# Patient Record
Sex: Male | Born: 1960 | Hispanic: No | Marital: Married | Smoking: Never smoker
Health system: Southern US, Community
[De-identification: ages and names within clinical notes are randomized; demographics above are authoritative.]

## PROBLEM LIST (undated history)

## (undated) DIAGNOSIS — I10 Essential (primary) hypertension: Secondary | ICD-10-CM

## (undated) DIAGNOSIS — K219 Gastro-esophageal reflux disease without esophagitis: Secondary | ICD-10-CM

## (undated) DIAGNOSIS — G4730 Sleep apnea, unspecified: Secondary | ICD-10-CM

## (undated) DIAGNOSIS — S82142A Displaced bicondylar fracture of left tibia, initial encounter for closed fracture: Secondary | ICD-10-CM

---

## 2016-10-29 DIAGNOSIS — J301 Allergic rhinitis due to pollen: Secondary | ICD-10-CM | POA: Diagnosis not present

## 2016-11-05 DIAGNOSIS — J301 Allergic rhinitis due to pollen: Secondary | ICD-10-CM | POA: Diagnosis not present

## 2016-11-05 DIAGNOSIS — J3089 Other allergic rhinitis: Secondary | ICD-10-CM | POA: Diagnosis not present

## 2016-11-07 DIAGNOSIS — L858 Other specified epidermal thickening: Secondary | ICD-10-CM | POA: Diagnosis not present

## 2016-11-07 DIAGNOSIS — L570 Actinic keratosis: Secondary | ICD-10-CM | POA: Diagnosis not present

## 2016-11-07 DIAGNOSIS — C44622 Squamous cell carcinoma of skin of right upper limb, including shoulder: Secondary | ICD-10-CM | POA: Diagnosis not present

## 2016-11-13 DIAGNOSIS — G4733 Obstructive sleep apnea (adult) (pediatric): Secondary | ICD-10-CM | POA: Diagnosis not present

## 2016-11-21 DIAGNOSIS — G4733 Obstructive sleep apnea (adult) (pediatric): Secondary | ICD-10-CM | POA: Diagnosis not present

## 2016-11-25 DIAGNOSIS — J3089 Other allergic rhinitis: Secondary | ICD-10-CM | POA: Diagnosis not present

## 2016-11-25 DIAGNOSIS — J301 Allergic rhinitis due to pollen: Secondary | ICD-10-CM | POA: Diagnosis not present

## 2016-11-27 DIAGNOSIS — J3089 Other allergic rhinitis: Secondary | ICD-10-CM | POA: Diagnosis not present

## 2016-12-02 DIAGNOSIS — J3089 Other allergic rhinitis: Secondary | ICD-10-CM | POA: Diagnosis not present

## 2016-12-02 DIAGNOSIS — J301 Allergic rhinitis due to pollen: Secondary | ICD-10-CM | POA: Diagnosis not present

## 2016-12-03 DIAGNOSIS — R7309 Other abnormal glucose: Secondary | ICD-10-CM | POA: Diagnosis not present

## 2016-12-03 DIAGNOSIS — G4733 Obstructive sleep apnea (adult) (pediatric): Secondary | ICD-10-CM | POA: Diagnosis not present

## 2016-12-03 DIAGNOSIS — Z1159 Encounter for screening for other viral diseases: Secondary | ICD-10-CM | POA: Diagnosis not present

## 2016-12-03 DIAGNOSIS — Z0000 Encounter for general adult medical examination without abnormal findings: Secondary | ICD-10-CM | POA: Diagnosis not present

## 2016-12-03 DIAGNOSIS — I10 Essential (primary) hypertension: Secondary | ICD-10-CM | POA: Diagnosis not present

## 2016-12-03 DIAGNOSIS — E7800 Pure hypercholesterolemia, unspecified: Secondary | ICD-10-CM | POA: Diagnosis not present

## 2016-12-03 DIAGNOSIS — Z1389 Encounter for screening for other disorder: Secondary | ICD-10-CM | POA: Diagnosis not present

## 2016-12-05 DIAGNOSIS — J3089 Other allergic rhinitis: Secondary | ICD-10-CM | POA: Diagnosis not present

## 2016-12-05 DIAGNOSIS — J301 Allergic rhinitis due to pollen: Secondary | ICD-10-CM | POA: Diagnosis not present

## 2016-12-09 DIAGNOSIS — J301 Allergic rhinitis due to pollen: Secondary | ICD-10-CM | POA: Diagnosis not present

## 2016-12-09 DIAGNOSIS — J3089 Other allergic rhinitis: Secondary | ICD-10-CM | POA: Diagnosis not present

## 2016-12-12 DIAGNOSIS — L570 Actinic keratosis: Secondary | ICD-10-CM | POA: Diagnosis not present

## 2016-12-12 DIAGNOSIS — Z85828 Personal history of other malignant neoplasm of skin: Secondary | ICD-10-CM | POA: Diagnosis not present

## 2016-12-12 DIAGNOSIS — L905 Scar conditions and fibrosis of skin: Secondary | ICD-10-CM | POA: Diagnosis not present

## 2016-12-23 DIAGNOSIS — J301 Allergic rhinitis due to pollen: Secondary | ICD-10-CM | POA: Diagnosis not present

## 2016-12-23 DIAGNOSIS — J3089 Other allergic rhinitis: Secondary | ICD-10-CM | POA: Diagnosis not present

## 2016-12-30 DIAGNOSIS — J301 Allergic rhinitis due to pollen: Secondary | ICD-10-CM | POA: Diagnosis not present

## 2016-12-30 DIAGNOSIS — J3089 Other allergic rhinitis: Secondary | ICD-10-CM | POA: Diagnosis not present

## 2017-01-02 DIAGNOSIS — J301 Allergic rhinitis due to pollen: Secondary | ICD-10-CM | POA: Diagnosis not present

## 2017-01-06 DIAGNOSIS — J3089 Other allergic rhinitis: Secondary | ICD-10-CM | POA: Diagnosis not present

## 2017-01-06 DIAGNOSIS — J301 Allergic rhinitis due to pollen: Secondary | ICD-10-CM | POA: Diagnosis not present

## 2017-01-13 DIAGNOSIS — J301 Allergic rhinitis due to pollen: Secondary | ICD-10-CM | POA: Diagnosis not present

## 2017-01-13 DIAGNOSIS — J3089 Other allergic rhinitis: Secondary | ICD-10-CM | POA: Diagnosis not present

## 2017-01-15 DIAGNOSIS — J3089 Other allergic rhinitis: Secondary | ICD-10-CM | POA: Diagnosis not present

## 2017-01-20 DIAGNOSIS — J301 Allergic rhinitis due to pollen: Secondary | ICD-10-CM | POA: Diagnosis not present

## 2017-01-20 DIAGNOSIS — J3089 Other allergic rhinitis: Secondary | ICD-10-CM | POA: Diagnosis not present

## 2017-01-27 DIAGNOSIS — J301 Allergic rhinitis due to pollen: Secondary | ICD-10-CM | POA: Diagnosis not present

## 2017-01-27 DIAGNOSIS — J3089 Other allergic rhinitis: Secondary | ICD-10-CM | POA: Diagnosis not present

## 2017-02-03 DIAGNOSIS — J301 Allergic rhinitis due to pollen: Secondary | ICD-10-CM | POA: Diagnosis not present

## 2017-02-03 DIAGNOSIS — J3089 Other allergic rhinitis: Secondary | ICD-10-CM | POA: Diagnosis not present

## 2017-02-05 ENCOUNTER — Other Ambulatory Visit: Payer: Self-pay | Admitting: Gastroenterology

## 2017-02-10 DIAGNOSIS — J301 Allergic rhinitis due to pollen: Secondary | ICD-10-CM | POA: Diagnosis not present

## 2017-02-10 DIAGNOSIS — J3089 Other allergic rhinitis: Secondary | ICD-10-CM | POA: Diagnosis not present

## 2017-02-17 DIAGNOSIS — J301 Allergic rhinitis due to pollen: Secondary | ICD-10-CM | POA: Diagnosis not present

## 2017-02-17 DIAGNOSIS — J3089 Other allergic rhinitis: Secondary | ICD-10-CM | POA: Diagnosis not present

## 2017-02-24 DIAGNOSIS — J3089 Other allergic rhinitis: Secondary | ICD-10-CM | POA: Diagnosis not present

## 2017-02-24 DIAGNOSIS — G4733 Obstructive sleep apnea (adult) (pediatric): Secondary | ICD-10-CM | POA: Diagnosis not present

## 2017-02-24 DIAGNOSIS — J301 Allergic rhinitis due to pollen: Secondary | ICD-10-CM | POA: Diagnosis not present

## 2017-02-26 DIAGNOSIS — L905 Scar conditions and fibrosis of skin: Secondary | ICD-10-CM | POA: Diagnosis not present

## 2017-02-26 DIAGNOSIS — Z85828 Personal history of other malignant neoplasm of skin: Secondary | ICD-10-CM | POA: Diagnosis not present

## 2017-03-03 DIAGNOSIS — J301 Allergic rhinitis due to pollen: Secondary | ICD-10-CM | POA: Diagnosis not present

## 2017-03-03 DIAGNOSIS — J3089 Other allergic rhinitis: Secondary | ICD-10-CM | POA: Diagnosis not present

## 2017-03-10 DIAGNOSIS — J301 Allergic rhinitis due to pollen: Secondary | ICD-10-CM | POA: Diagnosis not present

## 2017-03-10 DIAGNOSIS — J3089 Other allergic rhinitis: Secondary | ICD-10-CM | POA: Diagnosis not present

## 2017-03-17 DIAGNOSIS — J3089 Other allergic rhinitis: Secondary | ICD-10-CM | POA: Diagnosis not present

## 2017-03-17 DIAGNOSIS — J301 Allergic rhinitis due to pollen: Secondary | ICD-10-CM | POA: Diagnosis not present

## 2017-03-25 DIAGNOSIS — J3089 Other allergic rhinitis: Secondary | ICD-10-CM | POA: Diagnosis not present

## 2017-03-25 DIAGNOSIS — J301 Allergic rhinitis due to pollen: Secondary | ICD-10-CM | POA: Diagnosis not present

## 2017-04-01 ENCOUNTER — Ambulatory Visit (HOSPITAL_COMMUNITY): Payer: Commercial Managed Care - HMO | Admitting: Certified Registered Nurse Anesthetist

## 2017-04-01 ENCOUNTER — Encounter (HOSPITAL_COMMUNITY): Payer: Self-pay | Admitting: *Deleted

## 2017-04-01 ENCOUNTER — Encounter (HOSPITAL_COMMUNITY)
Admission: RE | Disposition: A | Discharge status: Home / Self Care | Payer: Self-pay | Source: Ambulatory Visit | Attending: Gastroenterology

## 2017-04-01 ENCOUNTER — Ambulatory Visit (HOSPITAL_COMMUNITY)
Admission: RE | Admit: 2017-04-01 | Discharge: 2017-04-01 | Disposition: A | Discharge status: Home / Self Care | Payer: Commercial Managed Care - HMO | Source: Ambulatory Visit | Attending: Gastroenterology | Admitting: Gastroenterology

## 2017-04-01 DIAGNOSIS — I10 Essential (primary) hypertension: Secondary | ICD-10-CM | POA: Insufficient documentation

## 2017-04-01 DIAGNOSIS — D122 Benign neoplasm of ascending colon: Secondary | ICD-10-CM | POA: Insufficient documentation

## 2017-04-01 DIAGNOSIS — K219 Gastro-esophageal reflux disease without esophagitis: Secondary | ICD-10-CM | POA: Insufficient documentation

## 2017-04-01 DIAGNOSIS — J309 Allergic rhinitis, unspecified: Secondary | ICD-10-CM | POA: Insufficient documentation

## 2017-04-01 DIAGNOSIS — E7800 Pure hypercholesterolemia, unspecified: Secondary | ICD-10-CM | POA: Insufficient documentation

## 2017-04-01 DIAGNOSIS — G4733 Obstructive sleep apnea (adult) (pediatric): Secondary | ICD-10-CM | POA: Insufficient documentation

## 2017-04-01 DIAGNOSIS — Z86010 Personal history of colonic polyps: Secondary | ICD-10-CM | POA: Diagnosis not present

## 2017-04-01 DIAGNOSIS — Z1211 Encounter for screening for malignant neoplasm of colon: Secondary | ICD-10-CM | POA: Insufficient documentation

## 2017-04-01 DIAGNOSIS — Z79899 Other long term (current) drug therapy: Secondary | ICD-10-CM | POA: Diagnosis not present

## 2017-04-01 DIAGNOSIS — K5730 Diverticulosis of large intestine without perforation or abscess without bleeding: Secondary | ICD-10-CM | POA: Diagnosis not present

## 2017-04-01 HISTORY — PX: COLONOSCOPY WITH PROPOFOL: SHX5780

## 2017-04-01 HISTORY — DX: Essential (primary) hypertension: I10

## 2017-04-01 SURGERY — COLONOSCOPY WITH PROPOFOL
Anesthesia: Monitor Anesthesia Care

## 2017-04-01 MED ORDER — LACTATED RINGERS IV SOLN
INTRAVENOUS | Status: DC
  Administered 2017-04-01: 07:00:00 via INTRAVENOUS

## 2017-04-01 MED ORDER — PROPOFOL 10 MG/ML IV BOLUS
INTRAVENOUS | Status: AC
  Filled 2017-04-01: qty 40

## 2017-04-01 MED ORDER — ONDANSETRON HCL 4 MG/2ML IJ SOLN
INTRAMUSCULAR | Status: AC
  Filled 2017-04-01: qty 2

## 2017-04-01 MED ORDER — PROPOFOL 500 MG/50ML IV EMUL
INTRAVENOUS | Status: DC | PRN
  Administered 2017-04-01: 150 ug/kg/min via INTRAVENOUS

## 2017-04-01 MED ORDER — ONDANSETRON HCL 4 MG/2ML IJ SOLN
INTRAMUSCULAR | Status: DC | PRN
  Administered 2017-04-01: 4 mg via INTRAVENOUS

## 2017-04-01 MED ORDER — SODIUM CHLORIDE 0.9 % IV SOLN: INTRAVENOUS | Status: DC

## 2017-04-01 MED ORDER — LIDOCAINE 2% (20 MG/ML) 5 ML SYRINGE
INTRAMUSCULAR | Status: AC
  Filled 2017-04-01: qty 5

## 2017-04-01 SURGICAL SUPPLY — 22 items

## 2017-04-01 NOTE — H&P (Signed)
Procedure: Surveillance colonoscopy. 01/23/2012 colonoscopy was performed with removal of a 4 mm transverse colon to fill her adenomatous polyp  History: The patient is a 56 year old male born 10-17-1961. He is scheduled to undergo a surveillance colonoscopy today.  Past medical history: Hypertension. Hypercholesterolemia. Gastroesophageal reflux. Allergic rhinitis. Obstructive sleep apnea syndrome.  Family history: No family history of colon cancer  Exam: The patient is alert and lying comfortably on the endoscopy stretcher. Abdomen is soft and nontender to palpation. Lungs are clear to auscultation. Cardiac exam reveals a regular rhythm.  Plan: Proceed with surveillance colonoscopy

## 2017-04-01 NOTE — Transfer of Care (Signed)
Immediate Anesthesia Transfer of Care Note  Patient: Dean Smith  Procedure(s) Performed: Procedure(s): COLONOSCOPY WITH PROPOFOL (N/A)  Patient Location: PACU  Anesthesia Type:MAC  Level of Consciousness:  sedated, patient cooperative and responds to stimulation  Airway & Oxygen Therapy:Patient Spontanous Breathing and Patient connected to face mask oxgen  Post-op Assessment:  Report given to PACU RN and Post -op Vital signs reviewed and stable  Post vital signs:  Reviewed and stable  Last Vitals:  Vitals:   04/01/17 0650  BP: (!) 147/75  Pulse: 62  Resp: 18  Temp: 37 C    Complications: No apparent anesthesia complications

## 2017-04-01 NOTE — Anesthesia Postprocedure Evaluation (Signed)
Anesthesia Post Note  Patient: Dean Smith  Procedure(s) Performed: Procedure(s) (LRB): COLONOSCOPY WITH PROPOFOL (N/A)     Patient location during evaluation: Endoscopy Anesthesia Type: MAC Level of consciousness: awake and alert Pain management: pain level controlled Vital Signs Assessment: post-procedure vital signs reviewed and stable Respiratory status: spontaneous breathing, nonlabored ventilation, respiratory function stable and patient connected to nasal cannula oxygen Cardiovascular status: stable and blood pressure returned to baseline Anesthetic complications: no    Last Vitals:  Vitals:   04/01/17 0810 04/01/17 0820  BP: 125/66 132/72  Pulse: 66 (!) 56  Resp: 20 18  Temp:  36.5 C    Last Pain:  Vitals:   04/01/17 0802  TempSrc: Oral                 Montez Hageman

## 2017-04-01 NOTE — Op Note (Signed)
[facility] Patient Name: Dean Smith Procedure Date: 04/01/2017 MRN: [id number] Attending MD: Garlan Fair , MD Date of Birth: 12/27/1960 CSN: [id number] Age: 56 Admit Type: Outpatient Procedure:                Colonoscopy Indications:              High risk colon cancer surveillance: 01/23/2012                            colonoscopy was performed with removal of a 4 mm                            transverse colon tubular adenomatous polyp Providers:                Garlan Fair, MD, Kingsley Plan, RN, Lillie Fragmin, RN, Cherylynn Ridges, Technician Referring MD:              Medicines:                Propofol per Anesthesia Complications:            No immediate complications. Estimated Blood Loss:     Estimated blood loss was minimal. Procedure:                Pre-Anesthesia Assessment:                           - Prior to the procedure, a History and Physical                            was performed, and patient medications and                            allergies were reviewed. The patient's tolerance of                            previous anesthesia was also reviewed. The risks                            and benefits of the procedure and the sedation                            options and risks were discussed with the patient.                            All questions were answered, and informed consent                            was obtained. Prior Anticoagulants: The patient has                            taken no previous anticoagulant or antiplatelet  agents. ASA Grade Assessment: II - A patient with                            mild systemic disease. After reviewing the risks                            and benefits, the patient was deemed in                            satisfactory condition to undergo the procedure.                           After obtaining informed consent, the colonoscope                    was passed under direct vision. Throughout the                            procedure, the patient's blood pressure, pulse, and                            oxygen saturations were monitored continuously. The                            EC-[id number] ([id number]) scope was introduced through                            the anus and advanced to the the cecum, identified                            by appendiceal orifice and ileocecal valve. The                            colonoscopy was performed without difficulty. The                            patient tolerated the procedure well. The quality                            of the bowel preparation was good. The terminal                            ileum, the ileocecal valve, the appendiceal orifice                            and the rectum were photographed. Scope In: 7:35:05 AM Scope Out: 7:53:42 AM Scope Withdrawal Time: 0 hours 12 minutes 17 seconds  Total Procedure Duration: 0 hours 18 minutes 37 seconds  Findings:      The perianal and digital rectal examinations were normal.      A 3 mm polyp was found in the mid ascending colon. The polyp was       sessile. The polyp was removed with a cold biopsy forceps. Resection and       retrieval were complete.      The  exam was otherwise without abnormality. Impression:               - One 3 mm polyp in the mid ascending colon,                            removed with a cold biopsy forceps. Resected and                            retrieved.                           - The examination was otherwise normal. Moderate Sedation:      N/A- Per Anesthesia Care Recommendation:           - Patient has a contact number available for                            emergencies. The signs and symptoms of potential                            delayed complications were discussed with the                            patient. Return to normal activities tomorrow.                            Written discharge  instructions were provided to the                            patient.                           - Repeat colonoscopy in 5 years for surveillance.                           - Resume previous diet.                           - Continue present medications. Procedure Code(s):        --- Professional ---                           [phone], Colonoscopy, flexible; with biopsy, single                            or multiple Diagnosis Code(s):        --- Professional ---                           Z86.010, Personal history of colonic polyps                           D12.2, Benign neoplasm of ascending colon CPT copyright 2016 American Medical Association. All rights reserved. The codes documented in this report are preliminary and upon coder review may  be revised to meet current compliance requirements. [name], MD Garlan Fair, MD 04/01/2017 7:59:56  AM This report has been signed electronically. Number of Addenda: 0

## 2017-04-01 NOTE — Discharge Instructions (Signed)

## 2017-04-01 NOTE — Anesthesia Preprocedure Evaluation (Signed)
Anesthesia Evaluation  Patient identified by MRN, date of birth, ID band Patient awake    Reviewed: Allergy & Precautions, NPO status , Patient's Chart, lab work & pertinent test results  Airway Mallampati: II  TM Distance: >3 FB Neck ROM: Full    Dental no notable dental hx.    Pulmonary neg pulmonary ROS,    Pulmonary exam normal breath sounds clear to auscultation       Cardiovascular hypertension, Pt. on medications negative cardio ROS Normal cardiovascular exam Rhythm:Regular Rate:Normal     Neuro/Psych negative neurological ROS  negative psych ROS   GI/Hepatic negative GI ROS, Neg liver ROS,   Endo/Other  negative endocrine ROS  Renal/GU negative Renal ROS  negative genitourinary   Musculoskeletal negative musculoskeletal ROS (+)   Abdominal   Peds negative pediatric ROS (+)  Hematology negative hematology ROS (+)   Anesthesia Other Findings   Reproductive/Obstetrics negative OB ROS                             Anesthesia Physical Anesthesia Plan  ASA: II  Anesthesia Plan: MAC   Post-op Pain Management:    Induction: Intravenous  PONV Risk Score and Plan: 1 and Treatment may vary due to age  Airway Management Planned:   Additional Equipment:   Intra-op Plan:   Post-operative Plan:   Informed Consent: I have reviewed the patients History and Physical, chart, labs and discussed the procedure including the risks, benefits and alternatives for the proposed anesthesia with the patient or authorized representative who has indicated his/her understanding and acceptance.   Dental advisory given  Plan Discussed with: CRNA  Anesthesia Plan Comments:         Anesthesia Quick Evaluation

## 2017-04-02 ENCOUNTER — Encounter (HOSPITAL_COMMUNITY): Payer: Self-pay | Admitting: Gastroenterology

## 2017-04-02 DIAGNOSIS — J301 Allergic rhinitis due to pollen: Secondary | ICD-10-CM | POA: Diagnosis not present

## 2017-04-02 DIAGNOSIS — J3089 Other allergic rhinitis: Secondary | ICD-10-CM | POA: Diagnosis not present

## 2017-04-14 DIAGNOSIS — J3089 Other allergic rhinitis: Secondary | ICD-10-CM | POA: Diagnosis not present

## 2017-04-14 DIAGNOSIS — J301 Allergic rhinitis due to pollen: Secondary | ICD-10-CM | POA: Diagnosis not present

## 2017-04-14 DIAGNOSIS — H1045 Other chronic allergic conjunctivitis: Secondary | ICD-10-CM | POA: Diagnosis not present

## 2017-04-21 DIAGNOSIS — J3089 Other allergic rhinitis: Secondary | ICD-10-CM | POA: Diagnosis not present

## 2017-04-21 DIAGNOSIS — J301 Allergic rhinitis due to pollen: Secondary | ICD-10-CM | POA: Diagnosis not present

## 2017-04-23 DIAGNOSIS — L817 Pigmented purpuric dermatosis: Secondary | ICD-10-CM | POA: Diagnosis not present

## 2017-04-28 DIAGNOSIS — J3089 Other allergic rhinitis: Secondary | ICD-10-CM | POA: Diagnosis not present

## 2017-04-28 DIAGNOSIS — J301 Allergic rhinitis due to pollen: Secondary | ICD-10-CM | POA: Diagnosis not present

## 2017-05-05 DIAGNOSIS — J301 Allergic rhinitis due to pollen: Secondary | ICD-10-CM | POA: Diagnosis not present

## 2017-05-05 DIAGNOSIS — J3089 Other allergic rhinitis: Secondary | ICD-10-CM | POA: Diagnosis not present

## 2017-05-19 DIAGNOSIS — J3089 Other allergic rhinitis: Secondary | ICD-10-CM | POA: Diagnosis not present

## 2017-05-19 DIAGNOSIS — J301 Allergic rhinitis due to pollen: Secondary | ICD-10-CM | POA: Diagnosis not present

## 2017-05-26 DIAGNOSIS — J301 Allergic rhinitis due to pollen: Secondary | ICD-10-CM | POA: Diagnosis not present

## 2017-05-26 DIAGNOSIS — J3089 Other allergic rhinitis: Secondary | ICD-10-CM | POA: Diagnosis not present

## 2017-06-02 DIAGNOSIS — J3089 Other allergic rhinitis: Secondary | ICD-10-CM | POA: Diagnosis not present

## 2017-06-02 DIAGNOSIS — J301 Allergic rhinitis due to pollen: Secondary | ICD-10-CM | POA: Diagnosis not present

## 2017-06-03 DIAGNOSIS — J309 Allergic rhinitis, unspecified: Secondary | ICD-10-CM | POA: Diagnosis not present

## 2017-06-03 DIAGNOSIS — I10 Essential (primary) hypertension: Secondary | ICD-10-CM | POA: Diagnosis not present

## 2017-06-03 DIAGNOSIS — K219 Gastro-esophageal reflux disease without esophagitis: Secondary | ICD-10-CM | POA: Diagnosis not present

## 2017-06-05 DIAGNOSIS — J301 Allergic rhinitis due to pollen: Secondary | ICD-10-CM | POA: Diagnosis not present

## 2017-06-09 DIAGNOSIS — J3089 Other allergic rhinitis: Secondary | ICD-10-CM | POA: Diagnosis not present

## 2017-06-09 DIAGNOSIS — J301 Allergic rhinitis due to pollen: Secondary | ICD-10-CM | POA: Diagnosis not present

## 2017-06-16 DIAGNOSIS — J3089 Other allergic rhinitis: Secondary | ICD-10-CM | POA: Diagnosis not present

## 2017-06-16 DIAGNOSIS — J301 Allergic rhinitis due to pollen: Secondary | ICD-10-CM | POA: Diagnosis not present

## 2017-06-23 DIAGNOSIS — J301 Allergic rhinitis due to pollen: Secondary | ICD-10-CM | POA: Diagnosis not present

## 2017-06-23 DIAGNOSIS — J3089 Other allergic rhinitis: Secondary | ICD-10-CM | POA: Diagnosis not present

## 2017-07-01 DIAGNOSIS — J3089 Other allergic rhinitis: Secondary | ICD-10-CM | POA: Diagnosis not present

## 2017-07-01 DIAGNOSIS — J301 Allergic rhinitis due to pollen: Secondary | ICD-10-CM | POA: Diagnosis not present

## 2017-07-04 DIAGNOSIS — J3089 Other allergic rhinitis: Secondary | ICD-10-CM | POA: Diagnosis not present

## 2017-07-08 DIAGNOSIS — J3089 Other allergic rhinitis: Secondary | ICD-10-CM | POA: Diagnosis not present

## 2017-07-08 DIAGNOSIS — J301 Allergic rhinitis due to pollen: Secondary | ICD-10-CM | POA: Diagnosis not present

## 2017-07-15 DIAGNOSIS — J3089 Other allergic rhinitis: Secondary | ICD-10-CM | POA: Diagnosis not present

## 2017-07-15 DIAGNOSIS — J301 Allergic rhinitis due to pollen: Secondary | ICD-10-CM | POA: Diagnosis not present

## 2017-07-22 DIAGNOSIS — J301 Allergic rhinitis due to pollen: Secondary | ICD-10-CM | POA: Diagnosis not present

## 2017-07-22 DIAGNOSIS — J3089 Other allergic rhinitis: Secondary | ICD-10-CM | POA: Diagnosis not present

## 2017-07-29 DIAGNOSIS — J3089 Other allergic rhinitis: Secondary | ICD-10-CM | POA: Diagnosis not present

## 2017-07-29 DIAGNOSIS — J301 Allergic rhinitis due to pollen: Secondary | ICD-10-CM | POA: Diagnosis not present

## 2017-08-05 DIAGNOSIS — J301 Allergic rhinitis due to pollen: Secondary | ICD-10-CM | POA: Diagnosis not present

## 2017-08-05 DIAGNOSIS — J3089 Other allergic rhinitis: Secondary | ICD-10-CM | POA: Diagnosis not present

## 2017-08-11 ENCOUNTER — Other Ambulatory Visit: Payer: Self-pay | Admitting: Dermatology

## 2017-08-11 DIAGNOSIS — D487 Neoplasm of uncertain behavior of other specified sites: Secondary | ICD-10-CM | POA: Diagnosis not present

## 2017-08-11 DIAGNOSIS — D485 Neoplasm of uncertain behavior of skin: Secondary | ICD-10-CM | POA: Diagnosis not present

## 2017-08-12 DIAGNOSIS — J301 Allergic rhinitis due to pollen: Secondary | ICD-10-CM | POA: Diagnosis not present

## 2017-08-12 DIAGNOSIS — J3089 Other allergic rhinitis: Secondary | ICD-10-CM | POA: Diagnosis not present

## 2017-08-19 DIAGNOSIS — J301 Allergic rhinitis due to pollen: Secondary | ICD-10-CM | POA: Diagnosis not present

## 2017-08-19 DIAGNOSIS — J3089 Other allergic rhinitis: Secondary | ICD-10-CM | POA: Diagnosis not present

## 2017-08-26 DIAGNOSIS — J301 Allergic rhinitis due to pollen: Secondary | ICD-10-CM | POA: Diagnosis not present

## 2017-08-26 DIAGNOSIS — J3089 Other allergic rhinitis: Secondary | ICD-10-CM | POA: Diagnosis not present

## 2017-08-27 DIAGNOSIS — I10 Essential (primary) hypertension: Secondary | ICD-10-CM | POA: Diagnosis not present

## 2017-08-27 DIAGNOSIS — G4733 Obstructive sleep apnea (adult) (pediatric): Secondary | ICD-10-CM | POA: Diagnosis not present

## 2017-09-02 DIAGNOSIS — J3089 Other allergic rhinitis: Secondary | ICD-10-CM | POA: Diagnosis not present

## 2017-09-02 DIAGNOSIS — J301 Allergic rhinitis due to pollen: Secondary | ICD-10-CM | POA: Diagnosis not present

## 2017-09-09 DIAGNOSIS — J301 Allergic rhinitis due to pollen: Secondary | ICD-10-CM | POA: Diagnosis not present

## 2017-09-09 DIAGNOSIS — J3089 Other allergic rhinitis: Secondary | ICD-10-CM | POA: Diagnosis not present

## 2017-09-22 DIAGNOSIS — J3089 Other allergic rhinitis: Secondary | ICD-10-CM | POA: Diagnosis not present

## 2017-09-22 DIAGNOSIS — J301 Allergic rhinitis due to pollen: Secondary | ICD-10-CM | POA: Diagnosis not present

## 2017-09-30 DIAGNOSIS — J301 Allergic rhinitis due to pollen: Secondary | ICD-10-CM | POA: Diagnosis not present

## 2017-09-30 DIAGNOSIS — J3089 Other allergic rhinitis: Secondary | ICD-10-CM | POA: Diagnosis not present

## 2017-10-13 DIAGNOSIS — J3089 Other allergic rhinitis: Secondary | ICD-10-CM | POA: Diagnosis not present

## 2017-10-13 DIAGNOSIS — J301 Allergic rhinitis due to pollen: Secondary | ICD-10-CM | POA: Diagnosis not present

## 2017-10-27 DIAGNOSIS — J3089 Other allergic rhinitis: Secondary | ICD-10-CM | POA: Diagnosis not present

## 2017-10-27 DIAGNOSIS — J301 Allergic rhinitis due to pollen: Secondary | ICD-10-CM | POA: Diagnosis not present

## 2017-11-03 DIAGNOSIS — G4733 Obstructive sleep apnea (adult) (pediatric): Secondary | ICD-10-CM | POA: Diagnosis not present

## 2017-11-03 DIAGNOSIS — J301 Allergic rhinitis due to pollen: Secondary | ICD-10-CM | POA: Diagnosis not present

## 2017-11-03 DIAGNOSIS — J3089 Other allergic rhinitis: Secondary | ICD-10-CM | POA: Diagnosis not present

## 2017-11-10 DIAGNOSIS — J3089 Other allergic rhinitis: Secondary | ICD-10-CM | POA: Diagnosis not present

## 2017-11-10 DIAGNOSIS — J301 Allergic rhinitis due to pollen: Secondary | ICD-10-CM | POA: Diagnosis not present

## 2017-11-24 DIAGNOSIS — J301 Allergic rhinitis due to pollen: Secondary | ICD-10-CM | POA: Diagnosis not present

## 2017-11-24 DIAGNOSIS — J3089 Other allergic rhinitis: Secondary | ICD-10-CM | POA: Diagnosis not present

## 2017-12-01 DIAGNOSIS — J301 Allergic rhinitis due to pollen: Secondary | ICD-10-CM | POA: Diagnosis not present

## 2017-12-01 DIAGNOSIS — J3089 Other allergic rhinitis: Secondary | ICD-10-CM | POA: Diagnosis not present

## 2017-12-04 DIAGNOSIS — E7800 Pure hypercholesterolemia, unspecified: Secondary | ICD-10-CM | POA: Diagnosis not present

## 2017-12-04 DIAGNOSIS — K219 Gastro-esophageal reflux disease without esophagitis: Secondary | ICD-10-CM | POA: Diagnosis not present

## 2017-12-04 DIAGNOSIS — Z125 Encounter for screening for malignant neoplasm of prostate: Secondary | ICD-10-CM | POA: Diagnosis not present

## 2017-12-04 DIAGNOSIS — Z1389 Encounter for screening for other disorder: Secondary | ICD-10-CM | POA: Diagnosis not present

## 2017-12-04 DIAGNOSIS — I10 Essential (primary) hypertension: Secondary | ICD-10-CM | POA: Diagnosis not present

## 2017-12-04 DIAGNOSIS — R7309 Other abnormal glucose: Secondary | ICD-10-CM | POA: Diagnosis not present

## 2017-12-04 DIAGNOSIS — G4733 Obstructive sleep apnea (adult) (pediatric): Secondary | ICD-10-CM | POA: Diagnosis not present

## 2017-12-04 DIAGNOSIS — Z0000 Encounter for general adult medical examination without abnormal findings: Secondary | ICD-10-CM | POA: Diagnosis not present

## 2017-12-05 DIAGNOSIS — G4733 Obstructive sleep apnea (adult) (pediatric): Secondary | ICD-10-CM | POA: Diagnosis not present

## 2017-12-08 DIAGNOSIS — J301 Allergic rhinitis due to pollen: Secondary | ICD-10-CM | POA: Diagnosis not present

## 2017-12-08 DIAGNOSIS — J3089 Other allergic rhinitis: Secondary | ICD-10-CM | POA: Diagnosis not present

## 2017-12-15 DIAGNOSIS — J3089 Other allergic rhinitis: Secondary | ICD-10-CM | POA: Diagnosis not present

## 2017-12-15 DIAGNOSIS — J301 Allergic rhinitis due to pollen: Secondary | ICD-10-CM | POA: Diagnosis not present

## 2017-12-22 DIAGNOSIS — J301 Allergic rhinitis due to pollen: Secondary | ICD-10-CM | POA: Diagnosis not present

## 2017-12-22 DIAGNOSIS — J3089 Other allergic rhinitis: Secondary | ICD-10-CM | POA: Diagnosis not present

## 2017-12-29 DIAGNOSIS — J3089 Other allergic rhinitis: Secondary | ICD-10-CM | POA: Diagnosis not present

## 2017-12-29 DIAGNOSIS — J301 Allergic rhinitis due to pollen: Secondary | ICD-10-CM | POA: Diagnosis not present

## 2018-01-01 DIAGNOSIS — J3089 Other allergic rhinitis: Secondary | ICD-10-CM | POA: Diagnosis not present

## 2018-01-01 DIAGNOSIS — J301 Allergic rhinitis due to pollen: Secondary | ICD-10-CM | POA: Diagnosis not present

## 2018-01-05 DIAGNOSIS — J301 Allergic rhinitis due to pollen: Secondary | ICD-10-CM | POA: Diagnosis not present

## 2018-01-05 DIAGNOSIS — J3089 Other allergic rhinitis: Secondary | ICD-10-CM | POA: Diagnosis not present

## 2018-01-12 DIAGNOSIS — J301 Allergic rhinitis due to pollen: Secondary | ICD-10-CM | POA: Diagnosis not present

## 2018-01-12 DIAGNOSIS — J3089 Other allergic rhinitis: Secondary | ICD-10-CM | POA: Diagnosis not present

## 2018-01-13 DIAGNOSIS — S82142A Displaced bicondylar fracture of left tibia, initial encounter for closed fracture: Secondary | ICD-10-CM

## 2018-01-13 HISTORY — DX: Displaced bicondylar fracture of left tibia, initial encounter for closed fracture: S82.142A

## 2018-01-14 ENCOUNTER — Other Ambulatory Visit: Payer: Self-pay | Admitting: Sports Medicine

## 2018-01-14 DIAGNOSIS — M25562 Pain in left knee: Secondary | ICD-10-CM | POA: Diagnosis not present

## 2018-01-14 DIAGNOSIS — S93491A Sprain of other ligament of right ankle, initial encounter: Secondary | ICD-10-CM | POA: Diagnosis not present

## 2018-01-14 DIAGNOSIS — S82122A Displaced fracture of lateral condyle of left tibia, initial encounter for closed fracture: Secondary | ICD-10-CM

## 2018-01-15 ENCOUNTER — Ambulatory Visit
Admission: RE | Admit: 2018-01-15 | Discharge: 2018-01-15 | Disposition: A | Discharge status: Home / Self Care | Payer: 59 | Source: Ambulatory Visit | Attending: Sports Medicine | Admitting: Sports Medicine

## 2018-01-15 DIAGNOSIS — S82122A Displaced fracture of lateral condyle of left tibia, initial encounter for closed fracture: Secondary | ICD-10-CM

## 2018-01-15 DIAGNOSIS — S82142A Displaced bicondylar fracture of left tibia, initial encounter for closed fracture: Secondary | ICD-10-CM | POA: Diagnosis not present

## 2018-01-19 ENCOUNTER — Other Ambulatory Visit: Payer: Commercial Managed Care - HMO

## 2018-01-20 ENCOUNTER — Encounter (HOSPITAL_BASED_OUTPATIENT_CLINIC_OR_DEPARTMENT_OTHER): Payer: Self-pay | Admitting: *Deleted

## 2018-01-20 ENCOUNTER — Other Ambulatory Visit: Payer: 59

## 2018-01-20 ENCOUNTER — Other Ambulatory Visit: Payer: Self-pay

## 2018-01-20 DIAGNOSIS — S82102A Unspecified fracture of upper end of left tibia, initial encounter for closed fracture: Secondary | ICD-10-CM | POA: Diagnosis not present

## 2018-01-20 DIAGNOSIS — J3089 Other allergic rhinitis: Secondary | ICD-10-CM | POA: Diagnosis not present

## 2018-01-20 DIAGNOSIS — J301 Allergic rhinitis due to pollen: Secondary | ICD-10-CM | POA: Diagnosis not present

## 2018-01-20 NOTE — Progress Notes (Signed)
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

## 2018-01-21 NOTE — Anesthesia Preprocedure Evaluation (Signed)
Anesthesia Evaluation  Patient identified by MRN, date of birth, ID band Patient awake    Reviewed: Allergy & Precautions, NPO status , Patient's Chart, lab work & pertinent test results  Airway Mallampati: II  TM Distance: >3 FB Neck ROM: Full    Dental no notable dental hx.    Pulmonary sleep apnea ,    Pulmonary exam normal breath sounds clear to auscultation       Cardiovascular hypertension, Pt. on medications Normal cardiovascular exam Rhythm:Regular Rate:Normal     Neuro/Psych negative neurological ROS  negative psych ROS   GI/Hepatic Neg liver ROS, GERD  Medicated,  Endo/Other  negative endocrine ROS  Renal/GU negative Renal ROS  negative genitourinary   Musculoskeletal negative musculoskeletal ROS (+)   Abdominal (+) + obese,   Peds negative pediatric ROS (+)  Hematology negative hematology ROS (+)   Anesthesia Other Findings   Reproductive/Obstetrics negative OB ROS                             Anesthesia Physical  Anesthesia Plan  ASA: II  Anesthesia Plan: General   Post-op Pain Management:    Induction: Intravenous  PONV Risk Score and Plan: 2 and Treatment may vary due to age, Treatment may vary due to age or medical condition, Ondansetron and Dexamethasone  Airway Management Planned: LMA  Additional Equipment:   Intra-op Plan:   Post-operative Plan:   Informed Consent: I have reviewed the patients History and Physical, chart, labs and discussed the procedure including the risks, benefits and alternatives for the proposed anesthesia with the patient or authorized representative who has indicated his/her understanding and acceptance.   Dental advisory given  Plan Discussed with: CRNA, Anesthesiologist and Surgeon  Anesthesia Plan Comments: ( )        Anesthesia Quick Evaluation

## 2018-01-22 ENCOUNTER — Encounter (HOSPITAL_BASED_OUTPATIENT_CLINIC_OR_DEPARTMENT_OTHER): Payer: Self-pay | Admitting: *Deleted

## 2018-01-22 ENCOUNTER — Ambulatory Visit (HOSPITAL_BASED_OUTPATIENT_CLINIC_OR_DEPARTMENT_OTHER): Payer: 59 | Admitting: Anesthesiology

## 2018-01-22 ENCOUNTER — Encounter (HOSPITAL_BASED_OUTPATIENT_CLINIC_OR_DEPARTMENT_OTHER)
Admission: RE | Disposition: A | Discharge status: Home / Self Care | Payer: Self-pay | Source: Ambulatory Visit | Attending: Orthopedic Surgery

## 2018-01-22 ENCOUNTER — Other Ambulatory Visit: Payer: Self-pay

## 2018-01-22 ENCOUNTER — Ambulatory Visit (HOSPITAL_COMMUNITY): Payer: 59

## 2018-01-22 ENCOUNTER — Ambulatory Visit (HOSPITAL_BASED_OUTPATIENT_CLINIC_OR_DEPARTMENT_OTHER)
Admission: RE | Admit: 2018-01-22 | Discharge: 2018-01-22 | Disposition: A | Discharge status: Home / Self Care | Payer: 59 | Source: Ambulatory Visit | Attending: Orthopedic Surgery | Admitting: Orthopedic Surgery

## 2018-01-22 DIAGNOSIS — G4730 Sleep apnea, unspecified: Secondary | ICD-10-CM | POA: Insufficient documentation

## 2018-01-22 DIAGNOSIS — Z79899 Other long term (current) drug therapy: Secondary | ICD-10-CM | POA: Diagnosis not present

## 2018-01-22 DIAGNOSIS — S82142A Displaced bicondylar fracture of left tibia, initial encounter for closed fracture: Secondary | ICD-10-CM | POA: Diagnosis present

## 2018-01-22 DIAGNOSIS — I10 Essential (primary) hypertension: Secondary | ICD-10-CM | POA: Insufficient documentation

## 2018-01-22 DIAGNOSIS — K219 Gastro-esophageal reflux disease without esophagitis: Secondary | ICD-10-CM | POA: Insufficient documentation

## 2018-01-22 DIAGNOSIS — W19XXXA Unspecified fall, initial encounter: Secondary | ICD-10-CM | POA: Insufficient documentation

## 2018-01-22 DIAGNOSIS — S82122A Displaced fracture of lateral condyle of left tibia, initial encounter for closed fracture: Secondary | ICD-10-CM | POA: Diagnosis not present

## 2018-01-22 DIAGNOSIS — S82141A Displaced bicondylar fracture of right tibia, initial encounter for closed fracture: Secondary | ICD-10-CM | POA: Diagnosis not present

## 2018-01-22 HISTORY — PX: ORIF TIBIA PLATEAU: SHX2132

## 2018-01-22 HISTORY — DX: Displaced bicondylar fracture of left tibia, initial encounter for closed fracture: S82.142A

## 2018-01-22 HISTORY — DX: Sleep apnea, unspecified: G47.30

## 2018-01-22 HISTORY — DX: Gastro-esophageal reflux disease without esophagitis: K21.9

## 2018-01-22 LAB — POCT I-STAT, CHEM 8
BUN: 18 mg/dL (ref 6–20)
Calcium, Ion: 1.2 mmol/L (ref 1.15–1.40)
Chloride: 100 mmol/L — ABNORMAL LOW (ref 101–111)
Creatinine, Ser: 0.9 mg/dL (ref 0.61–1.24)
Glucose, Bld: 108 mg/dL — ABNORMAL HIGH (ref 65–99)
HEMATOCRIT: 45 % (ref 39.0–52.0)
Hemoglobin: 15.3 g/dL (ref 13.0–17.0)
POTASSIUM: 3.4 mmol/L — AB (ref 3.5–5.1)
SODIUM: 140 mmol/L (ref 135–145)
TCO2: 28 mmol/L (ref 22–32)

## 2018-01-22 SURGERY — OPEN REDUCTION INTERNAL FIXATION (ORIF) TIBIAL PLATEAU
Anesthesia: General | Site: Leg Upper | Laterality: Left

## 2018-01-22 MED ORDER — ONDANSETRON 4 MG PO TBDP: 4.0000 mg | ORAL_TABLET | Freq: Three times a day (TID) | ORAL | 0 refills | Status: AC | PRN

## 2018-01-22 MED ORDER — POVIDONE-IODINE 10 % EX SWAB
2.0000 "application " | Freq: Once | CUTANEOUS | Status: DC
  Filled 2018-01-22: qty 2

## 2018-01-22 MED ORDER — PROPOFOL 10 MG/ML IV BOLUS
INTRAVENOUS | Status: DC | PRN
  Administered 2018-01-22: 200 mg via INTRAVENOUS
  Administered 2018-01-22: 100 mg via INTRAVENOUS

## 2018-01-22 MED ORDER — ACETAMINOPHEN 160 MG/5ML PO SOLN
325.0000 mg | ORAL | Status: DC | PRN
  Filled 2018-01-22: qty 20.3

## 2018-01-22 MED ORDER — FENTANYL CITRATE (PF) 100 MCG/2ML IJ SOLN
INTRAMUSCULAR | Status: AC
  Filled 2018-01-22: qty 2

## 2018-01-22 MED ORDER — FENTANYL CITRATE (PF) 100 MCG/2ML IJ SOLN
INTRAMUSCULAR | Status: DC | PRN
  Administered 2018-01-22 (×2): 50 ug via INTRAVENOUS
  Administered 2018-01-22: 100 ug via INTRAVENOUS

## 2018-01-22 MED ORDER — MEPERIDINE HCL 25 MG/ML IJ SOLN
INTRAMUSCULAR | Status: AC
Start: 2018-01-22 — End: 2018-01-22
  Filled 2018-01-22: qty 1

## 2018-01-22 MED ORDER — OXYCODONE HCL 5 MG PO TABS
ORAL_TABLET | ORAL | Status: AC
  Filled 2018-01-22: qty 1

## 2018-01-22 MED ORDER — DEXAMETHASONE SODIUM PHOSPHATE 10 MG/ML IJ SOLN
INTRAMUSCULAR | Status: AC
  Filled 2018-01-22: qty 1

## 2018-01-22 MED ORDER — OXYCODONE HCL 5 MG PO TABS
5.0000 mg | ORAL_TABLET | Freq: Once | ORAL | Status: AC | PRN
  Administered 2018-01-22: 5 mg via ORAL
  Filled 2018-01-22: qty 1

## 2018-01-22 MED ORDER — ONDANSETRON HCL 4 MG/2ML IJ SOLN
INTRAMUSCULAR | Status: DC | PRN
Start: 2018-01-22 — End: 2018-01-22
  Administered 2018-01-22: 4 mg via INTRAVENOUS

## 2018-01-22 MED ORDER — LIDOCAINE 2% (20 MG/ML) 5 ML SYRINGE
INTRAMUSCULAR | Status: DC | PRN
  Administered 2018-01-22: 100 mg via INTRAVENOUS

## 2018-01-22 MED ORDER — SCOPOLAMINE 1 MG/3DAYS TD PT72
MEDICATED_PATCH | TRANSDERMAL | Status: AC
  Filled 2018-01-22: qty 1

## 2018-01-22 MED ORDER — MIDAZOLAM HCL 2 MG/2ML IJ SOLN
INTRAMUSCULAR | Status: AC
  Filled 2018-01-22: qty 2

## 2018-01-22 MED ORDER — ACETAMINOPHEN 325 MG PO TABS
325.0000 mg | ORAL_TABLET | ORAL | Status: DC | PRN
  Filled 2018-01-22: qty 2

## 2018-01-22 MED ORDER — BUPIVACAINE HCL (PF) 0.25 % IJ SOLN
INTRAMUSCULAR | Status: DC | PRN
  Administered 2018-01-22: 20 mL

## 2018-01-22 MED ORDER — CHLORHEXIDINE GLUCONATE 4 % EX LIQD
60.0000 mL | Freq: Once | CUTANEOUS | Status: DC
  Filled 2018-01-22: qty 118

## 2018-01-22 MED ORDER — CEFAZOLIN SODIUM-DEXTROSE 2-4 GM/100ML-% IV SOLN
2.0000 g | INTRAVENOUS | Status: AC
  Administered 2018-01-22: 2 g via INTRAVENOUS
  Filled 2018-01-22: qty 100

## 2018-01-22 MED ORDER — ONDANSETRON HCL 4 MG/2ML IJ SOLN
4.0000 mg | Freq: Once | INTRAMUSCULAR | Status: DC | PRN
  Filled 2018-01-22: qty 2

## 2018-01-22 MED ORDER — LIDOCAINE 2% (20 MG/ML) 5 ML SYRINGE
INTRAMUSCULAR | Status: AC
  Filled 2018-01-22: qty 5

## 2018-01-22 MED ORDER — OXYCODONE HCL 5 MG/5ML PO SOLN
5.0000 mg | Freq: Once | ORAL | Status: AC | PRN
  Filled 2018-01-22: qty 5

## 2018-01-22 MED ORDER — ACETAMINOPHEN 10 MG/ML IV SOLN
INTRAVENOUS | Status: DC | PRN
  Administered 2018-01-22: 1000 mg via INTRAVENOUS

## 2018-01-22 MED ORDER — SCOPOLAMINE 1 MG/3DAYS TD PT72
1.0000 | MEDICATED_PATCH | TRANSDERMAL | Status: DC
  Administered 2018-01-22: 1.5 mg via TRANSDERMAL
  Filled 2018-01-22: qty 1

## 2018-01-22 MED ORDER — OXYCODONE HCL 5 MG PO TABS: 5.0000 mg | ORAL_TABLET | ORAL | 0 refills | Status: AC | PRN

## 2018-01-22 MED ORDER — ACETAMINOPHEN 10 MG/ML IV SOLN
INTRAVENOUS | Status: AC
  Filled 2018-01-22: qty 100

## 2018-01-22 MED ORDER — PROPOFOL 10 MG/ML IV BOLUS
INTRAVENOUS | Status: AC
  Filled 2018-01-22: qty 40

## 2018-01-22 MED ORDER — FENTANYL CITRATE (PF) 100 MCG/2ML IJ SOLN
25.0000 ug | INTRAMUSCULAR | Status: DC | PRN
  Administered 2018-01-22: 50 ug via INTRAVENOUS
  Filled 2018-01-22: qty 1

## 2018-01-22 MED ORDER — CEFAZOLIN SODIUM-DEXTROSE 2-4 GM/100ML-% IV SOLN
INTRAVENOUS | Status: AC
  Filled 2018-01-22: qty 100

## 2018-01-22 MED ORDER — LACTATED RINGERS IV SOLN
INTRAVENOUS | Status: DC
  Administered 2018-01-22 (×2): via INTRAVENOUS
  Filled 2018-01-22: qty 1000

## 2018-01-22 MED ORDER — MEPERIDINE HCL 25 MG/ML IJ SOLN
6.2500 mg | INTRAMUSCULAR | Status: DC | PRN
  Administered 2018-01-22 (×2): 6.25 mg via INTRAVENOUS
  Filled 2018-01-22: qty 1

## 2018-01-22 MED ORDER — KETOROLAC TROMETHAMINE 30 MG/ML IJ SOLN
INTRAMUSCULAR | Status: DC | PRN
  Administered 2018-01-22: 30 mg via INTRAVENOUS

## 2018-01-22 MED ORDER — WHITE PETROLATUM EX OINT
TOPICAL_OINTMENT | CUTANEOUS | Status: AC
  Filled 2018-01-22: qty 5

## 2018-01-22 MED ORDER — ONDANSETRON HCL 4 MG/2ML IJ SOLN
INTRAMUSCULAR | Status: AC
  Filled 2018-01-22: qty 2

## 2018-01-22 MED ORDER — DEXAMETHASONE SODIUM PHOSPHATE 10 MG/ML IJ SOLN
INTRAMUSCULAR | Status: DC | PRN
  Administered 2018-01-22: 10 mg via INTRAVENOUS

## 2018-01-22 MED ORDER — MIDAZOLAM HCL 5 MG/5ML IJ SOLN
INTRAMUSCULAR | Status: DC | PRN
  Administered 2018-01-22: 2 mg via INTRAVENOUS

## 2018-01-22 SURGICAL SUPPLY — 75 items
BAG SPEC THK2 15X12 ZIP CLS (MISCELLANEOUS)
BAG ZIPLOCK 12X15 (MISCELLANEOUS) ×1 IMPLANT
BANDAGE ACE 6X5 VEL STRL LF (GAUZE/BANDAGES/DRESSINGS) ×2 IMPLANT
BIT DRILL CALIBR QC 2.5X250 (BIT) ×2 IMPLANT
BIT DRILL CALIBR QC 2.8X250 (BIT) ×2 IMPLANT
BIT DRILL PERC QC 2.8X200 100 (BIT) IMPLANT
BNDG COHESIVE 4X5 TAN STRL (GAUZE/BANDAGES/DRESSINGS) ×3 IMPLANT
BNDG COHESIVE 6X5 TAN STRL LF (GAUZE/BANDAGES/DRESSINGS) ×3 IMPLANT
CLEANER CAUTERY TIP 5X5 PAD (MISCELLANEOUS) IMPLANT
CLOSURE WOUND 1/2 X4 (GAUZE/BANDAGES/DRESSINGS)
COVER SURGICAL LIGHT HANDLE (MISCELLANEOUS) ×3 IMPLANT
CUFF TOURN SGL QUICK 34 (TOURNIQUET CUFF) ×3
CUFF TRNQT CYL 34X4X40X1 (TOURNIQUET CUFF) ×1 IMPLANT
DRAPE C-ARM 42X120 X-RAY (DRAPES) ×3 IMPLANT
DRAPE C-ARMOR (DRAPES) ×2 IMPLANT
DRAPE EXTREMITY TIBURON (DRAPES) ×2 IMPLANT
DRAPE INCISE IOBAN 66X45 STRL (DRAPES) ×3 IMPLANT
DRAPE SHEET LG 3/4 BI-LAMINATE (DRAPES) ×3 IMPLANT
DRAPE U-SHAPE 47X51 STRL (DRAPES) ×3 IMPLANT
DRILL BIT QUICK COUP 2.8MM 100 (BIT) ×2
DRSG ADAPTIC 3X8 NADH LF (GAUZE/BANDAGES/DRESSINGS) ×2 IMPLANT
DRSG EMULSION OIL 3X16 NADH (GAUZE/BANDAGES/DRESSINGS) ×3 IMPLANT
DURAPREP 26ML APPLICATOR (WOUND CARE) ×5 IMPLANT
ELECT REM PT RETURN 15FT ADLT (MISCELLANEOUS) ×3 IMPLANT
GAUZE SPONGE 4X4 12PLY STRL (GAUZE/BANDAGES/DRESSINGS) ×4 IMPLANT
GLOVE BIOGEL M 7.0 STRL (GLOVE) IMPLANT
GLOVE BIOGEL PI IND STRL 7.5 (GLOVE) ×1 IMPLANT
GLOVE BIOGEL PI IND STRL 8.5 (GLOVE) ×1 IMPLANT
GLOVE BIOGEL PI INDICATOR 7.5 (GLOVE) ×2
GLOVE BIOGEL PI INDICATOR 8.5 (GLOVE)
GLOVE ECLIPSE 8.0 STRL XLNG CF (GLOVE) IMPLANT
GLOVE ORTHO TXT STRL SZ7.5 (GLOVE) ×2 IMPLANT
GLOVE SURG ORTHO 8.0 STRL STRW (GLOVE) ×1 IMPLANT
GOWN STRL REUS W/TWL LRG LVL3 (GOWN DISPOSABLE) ×3 IMPLANT
GOWN STRL REUS W/TWL XL LVL3 (GOWN DISPOSABLE) ×6 IMPLANT
IMMOBILIZER KNEE 20 (SOFTGOODS)
IMMOBILIZER KNEE 20 THIGH 36 (SOFTGOODS) ×1 IMPLANT
KIT BASIN OR (CUSTOM PROCEDURE TRAY) ×3 IMPLANT
MANIFOLD NEPTUNE II (INSTRUMENTS) ×3 IMPLANT
NDL SAFETY ECLIPSE 18X1.5 (NEEDLE) IMPLANT
NDL SPNL 22GX5 LNG QUINC BK (NEEDLE) IMPLANT
NEEDLE HYPO 18GX1.5 SHARP (NEEDLE) ×3
NEEDLE SPNL 22GX5 LNG QUINC BK (NEEDLE) ×3 IMPLANT
NS IRRIG 1000ML POUR BTL (IV SOLUTION) ×3 IMPLANT
PACK ORTHO EXTREMITY (CUSTOM PROCEDURE TRAY) ×1 IMPLANT
PAD ABD 8X10 STRL (GAUZE/BANDAGES/DRESSINGS) ×4 IMPLANT
PAD CAST 4YDX4 CTTN HI CHSV (CAST SUPPLIES) ×2 IMPLANT
PAD CLEANER CAUTERY TIP 5X5 (MISCELLANEOUS) ×2
PADDING CAST COTTON 4X4 STRL (CAST SUPPLIES) ×6
PLATE TIBIA LG BEND (Plate) ×2 IMPLANT
POSITIONER SURGICAL ARM (MISCELLANEOUS) ×1 IMPLANT
SCREW CORTEX 3.5 38MM (Screw) ×2 IMPLANT
SCREW CORTEX 3.5 40MM (Screw) ×2 IMPLANT
SCREW CORTEX 3.5 50MM (Screw) ×2 IMPLANT
SCREW LOCK 3.5X85 (Screw) ×2 IMPLANT
SCREW LOCK CORT ST 3.5X38 (Screw) IMPLANT
SCREW LOCK CORT ST 3.5X40 (Screw) IMPLANT
SCREW LOCKING 3.5X80MM VA (Screw) ×4 IMPLANT
SCREW LOCKING VA 3.5X75MM (Screw) ×2 IMPLANT
SET PAD KNEE POSITIONER (MISCELLANEOUS) ×1 IMPLANT
SPONGE LAP 18X18 X RAY DECT (DISPOSABLE) ×4 IMPLANT
STAPLER VISISTAT 35W (STAPLE) ×2 IMPLANT
STOCKINETTE 8 INCH (MISCELLANEOUS) ×1 IMPLANT
STRIP CLOSURE SKIN 1/2X4 (GAUZE/BANDAGES/DRESSINGS) ×1 IMPLANT
SUCTION FRAZIER HANDLE 10FR (MISCELLANEOUS) ×2
SUCTION TUBE FRAZIER 10FR DISP (MISCELLANEOUS) ×1 IMPLANT
SUT ETHILON 4 0 PS 2 18 (SUTURE) ×3 IMPLANT
SUT MNCRL AB 4-0 PS2 18 (SUTURE) ×3 IMPLANT
SUT VIC AB 1 CT1 27 (SUTURE) ×15
SUT VIC AB 1 CT1 27XBRD ANTBC (SUTURE) ×5 IMPLANT
SUT VIC AB 2-0 CT2 27 (SUTURE) ×3 IMPLANT
SYR CONTROL 10ML LL (SYRINGE) ×2 IMPLANT
SYRINGE 20CC LL (MISCELLANEOUS) ×2 IMPLANT
TOWEL OR 17X26 10 PK STRL BLUE (TOWEL DISPOSABLE) ×6 IMPLANT
WATER STERILE IRR 1000ML POUR (IV SOLUTION) ×1 IMPLANT

## 2018-01-22 NOTE — Transfer of Care (Signed)
Immediate Anesthesia Transfer of Care Note  Patient: Dean Smith  Procedure(s) Performed: OPEN REDUCTION INTERNAL FIXATION (ORIF) LEFT TIBIAL PLATEAU (Left Leg Upper)  Patient Location: PACU  Anesthesia Type:General  Level of Consciousness: awake, alert  and oriented  Airway & Oxygen Therapy: Patient Spontanous Breathing and Patient connected to nasal cannula oxygen  Post-op Assessment: Report given to RN  Post vital signs: Reviewed and stable  Last Vitals: 98.6 Vitals Value Taken Time  BP 123/74 01/22/2018  9:06 AM  Temp    Pulse 69 01/22/2018  9:09 AM  Resp 19 01/22/2018  9:09 AM  SpO2 100 % 01/22/2018  9:09 AM  Vitals shown include unvalidated device data.  Last Pain:  Vitals:   01/22/18 0603  TempSrc:   PainSc: 0-No pain      Patients Stated Pain Goal: 8 (16/10/96 0454)  Complications: No apparent anesthesia complications

## 2018-01-22 NOTE — Anesthesia Postprocedure Evaluation (Signed)
Anesthesia Post Note  Patient: Carder A Ebey  Procedure(s) Performed: OPEN REDUCTION INTERNAL FIXATION (ORIF) LEFT TIBIAL PLATEAU (Left Leg Upper)     Patient location during evaluation: PACU Anesthesia Type: General Level of consciousness: awake and alert Pain management: pain level controlled Vital Signs Assessment: post-procedure vital signs reviewed and stable Respiratory status: spontaneous breathing, nonlabored ventilation, respiratory function stable and patient connected to nasal cannula oxygen Cardiovascular status: blood pressure returned to baseline and stable Postop Assessment: no apparent nausea or vomiting Anesthetic complications: no    Last Vitals:  Vitals:   01/22/18 0930 01/22/18 0932  BP:  126/74  Pulse: 66 67  Resp: 16 17  Temp:    SpO2: 98% 98%    Last Pain:  Vitals:   01/22/18 0932  TempSrc:   PainSc: 10-Worst pain ever                 Catilyn Boggus

## 2018-01-22 NOTE — Brief Op Note (Signed)
01/22/2018  9:13 AM  PATIENT:  Dean Smith  57 y.o. male  PRE-OPERATIVE DIAGNOSIS:  Left tibial plateau fracture  POST-OPERATIVE DIAGNOSIS:  Left tibial plateau fracture  PROCEDURE:  Procedure(s) with comments: OPEN REDUCTION INTERNAL FIXATION (ORIF) LEFT TIBIAL PLATEAU (Left) - 90 mins  SURGEON:  Surgeon(s) and Role:    * Stann Mainland, Elly Modena, MD - Primary  PHYSICIAN ASSISTANT:   ASSISTANTS: none   ANESTHESIA:   general  EBL:  50 mL   BLOOD ADMINISTERED:none  DRAINS: none   LOCAL MEDICATIONS USED:  MARCAINE     SPECIMEN:  No Specimen  DISPOSITION OF SPECIMEN:  N/A  COUNTS:  YES  TOURNIQUET:  * Missing tourniquet times found for documented tourniquets in log: [id number] *  DICTATION: .Note written in EPIC  PLAN OF CARE: Discharge to home after PACU  PATIENT DISPOSITION:  PACU - hemodynamically stable.   Delay start of Pharmacological VTE agent (>24hrs) due to surgical blood loss or risk of bleeding: not applicable

## 2018-01-22 NOTE — Op Note (Signed)
Date of Surgery: 01/22/2018  INDICATIONS: Mr. Friedhoff is a 57 y.o.-year-old male who sustained a left tibial plateau fracture; he was indicated for open reduction and internal fixation due to the displaced nature of the articular fracture and came to the operating room today for this procedure. The patient did consent to the procedure after discussion of the risks and benefits.  PREOPERATIVE DIAGNOSIS: bilateral tibial plateau fracture (lateral condyle).  POSTOPERATIVE DIAGNOSIS: Same.  PROCEDURE: left tibial plateau open reduction and internal fixation.  CPT [phone]   SURGEON: Geralynn Rile, M.D.  ASSIST: none, .  ANESTHESIA:  general  IV FLUIDS AND URINE: See anesthesia.  ESTIMATED BLOOD LOSS: 75 mL.  IMPLANTS: Synthes proximal tibial locking plate with 3.5 mm locking and nonlocking screws.    DRAINS: none  COMPLICATIONS: None.  Tourniquet: None  DESCRIPTION OF PROCEDURE: The patient was brought to the operating room and placed supine on the operating table.  The patient had been signed prior to the procedure and this was documented. The patient had the anesthesia placed by the anesthesiologist.  The prep verification and incision time-outs were performed to confirm that this was the correct patient, site, side and location. The patient had an SCD on the opposite lower extremity. The patient did receive antibiotics prior to the incision and was re-dosed during the procedure as needed at indicated intervals.  The patient had the lower extremity prepped and draped in the standard surgical fashion.  The bony landmarks were palpated and the incision was drawn with a marker.  The incision was taken down through the skin and subcutaneous tissue with the knife down to the fascia. The fascia was then incised in line with the skin incision, taking care to extend through Gerdy's tubercle. The fascia was then taken anteriorly and posteriorly off of Gerdy's tubercle, and this exposed the joint  capsule. The anterior compartment was also gently elevated from distal to proximal off of the tibia to expose the fracture, taking care to leave the fascia available for later repair.  The large periarticular clamp was placed at the proximal tibia and a small incision was made on the medial side to place this in the medial metaphysis area under fluoroscopy. This was done to reduce the widened condyles together.  This was followed both under direct visualization of the joint as well as on AP and lateral fluoroscopic imaging to confirm that the joint was well reduced.   1The lateral plate was then placed on the apex of the fracture and this was also clamped down with the peri-articular clamp.  All screws were then placed in the standard fashion, first drilling, then measuring with a depth gauge, and then placing the screws on power and tightening by hand. The cortical screw was first placed just distal to the apex to bring the plate to the bone and form an axilla for the fracture.  The most proximal locking screws were then all placed. The remaining distal cortical screws were then placed.  All screws were confirmed on both AP and lateral views including both the length and location. The wound was copiously irrigated with 3 liters of saline via cysto tubing. The deep fascia was closed with 0 Vicryl figure-of-eight interrupted suture. This was closed with no difficulty or tension in the anterior compartment. The subcutaneous layer was closed with 2-0 vicryl. The skin was then reapproximated with 3-0 nylon horizontal mattress sutures. The wounds were cleaned and dried a final time. A sterile dressing was placed. The  patient was then wrapped in an Ace. The patient was then transferred back to the bed and left the operating room in stable condition.  All sponge and instrument counts were correct.  POSTOPERATIVE PLAN: Mr. Engelstad will remain nonweightbearing on this leg for approximately 6 weeks; he will return for  suture removal in 2 weeks.  Mr. Britta Mccreedy is okay for full active passive range of motion at the knee as tolerated.  Mr. Whitter will receive DVT prophylaxis based on other medications, activity level, and risk ratio of bleeding to thrombosis.

## 2018-01-22 NOTE — Discharge Instructions (Signed)
Orthopedic instructions:  -No weightbearing to the left lower extremity -Elevate the left lower extremity with "toes above nose."  As often as possible.  Apply ice to the left knee for 30 minutes out of each hour you are awake. -Do not get your wounds wet. -You may remove your surgical bandages 4 days after surgery and replaced with clean dry dressings to be changed daily. -For the prevention of blood clots take an 81 mg aspirin twice daily for 6 weeks. -Return to see Dr. Stann Mainland in 2 weeks for wound check and suture removal. -You are okay to bend the knee as tolerated throughout the day.  This is encouraged. - for mild pain use tylenol and/or ibuprofen as needed.  For severe pain use oxycodone as directed  Do not take any Tylenol ornonsteroidal anti inflammatories (Ibuprofen, Advil, Aleve, Motrin) until after 2:00 pm today.    Post Anesthesia Home Care Instructions  Activity: Get plenty of rest for the remainder of the day. A responsible individual must stay with you for 24 hours following the procedure.  For the next 24 hours, DO NOT: -Drive a car -Paediatric nurse -Drink alcoholic beverages -Take any medication unless instructed by your physician -Make any legal decisions or sign important papers.  Meals: Start with liquid foods such as gelatin or soup. Progress to regular foods as tolerated. Avoid greasy, spicy, heavy foods. If nausea and/or vomiting occur, drink only clear liquids until the nausea and/or vomiting subsides. Call your physician if vomiting continues.  Special Instructions/Symptoms: Your throat may feel dry or sore from the anesthesia or the breathing tube placed in your throat during surgery. If this causes discomfort, gargle with warm salt water. The discomfort should disappear within 24 hours.

## 2018-01-22 NOTE — H&P (Signed)
ORTHOPAEDIC H and P  REQUESTING PHYSICIAN: Nicholes Stairs, MD  PCP:  Wenda Low, MD  Chief Complaint: Left tibial plateau fracture  HPI: Dean Smith is a 57 y.o. male who complains of left knee pain following a fall last Tuesday.  He was evaluated in the office and found to have a left lateral tibial plateau fracture with some displacement.  CT scan revealed more than 6 mm of articular displacement.  He was indicated for operative fixation.  He presents today for that surgery.  He has no new complaints or new questions at this time.  Past Medical History:  Diagnosis Date  . Fracture of left tibial plateau 01/13/2018  . GERD (gastroesophageal reflux disease)   . Hypertension   . Sleep apnea    Past Surgical History:  Procedure Laterality Date  . COLONOSCOPY WITH PROPOFOL N/A 04/01/2017   Procedure: COLONOSCOPY WITH PROPOFOL;  Surgeon: Garlan Fair, MD;  Location: [facility];  Service: Endoscopy;  Laterality: N/A;   Social History   Socioeconomic History  . Marital status: Married    Spouse name: Not on file  . Number of children: Not on file  . Years of education: Not on file  . Highest education level: Not on file  Occupational History  . Not on file  Social Needs  . Financial resource strain: Not on file  . Food insecurity:    Worry: Not on file    Inability: Not on file  . Transportation needs:    Medical: Not on file    Non-medical: Not on file  Tobacco Use  . Smoking status: Never Smoker  . Smokeless tobacco: Never Used  Substance and Sexual Activity  . Alcohol use: Yes    Comment: occasional  . Drug use: No  . Sexual activity: Not on file  Lifestyle  . Physical activity:    Days per week: Not on file    Minutes per session: Not on file  . Stress: Not on file  Relationships  . Social connections:    Talks on phone: Not on file    Gets together: Not on file    Attends religious service: Not on file    Active member of club or  organization: Not on file    Attends meetings of clubs or organizations: Not on file    Relationship status: Not on file  Other Topics Concern  . Not on file  Social History Narrative  . Not on file   History reviewed. No pertinent family history. No Known Allergies Prior to Admission medications   Medication Sig Start Date End Date Taking? Authorizing Provider  amLODipine (NORVASC) 5 MG tablet Take 5 mg by mouth daily.   Yes [provider]  cetirizine (ZYRTEC) 10 MG tablet Take 10 mg by mouth daily.   Yes [provider]  hydrochlorothiazide (HYDRODIURIL) 25 MG tablet Take 25 mg by mouth daily.   Yes [provider]  lisinopril (PRINIVIL,ZESTRIL) 40 MG tablet Take 40 mg by mouth daily.   Yes [provider]  Omega-3 Fatty Acids (OMEGA-3 FISH OIL) 1200 MG CAPS Take by mouth daily.   Yes [provider]  pantoprazole (PROTONIX) 40 MG tablet Take 40 mg by mouth as needed.    [provider]   No results found.  Positive ROS: All other systems have been reviewed and were otherwise negative with the exception of those mentioned in the HPI and as above.  Physical Exam: General: Alert, no  acute distress Cardiovascular: No pedal edema Respiratory: No cyanosis, no use of accessory musculature GI: No organomegaly, abdomen is soft and non-tender Skin: No lesions in the area of chief complaint Neurologic: Sensation intact distally Psychiatric: Patient is competent for consent with normal mood and affect Lymphatic: No axillary or cervical lymphadenopathy    Assessment: Left Schatzker 2 tibial plateau fracture  Plan: -Plan for operative fixation today.  This will be open reduction internal fixation.  We again reviewed the risk, benefits, and indications of this procedure at length.  These include but are not limited to bleeding, infection, damage to surrounding neurovascular structures, posttraumatic arthritis, development of blood  clots, and the risk of anesthesia.  He has provided informed consent. -We will plan for discharge home from PACU postoperatively.    Nicholes Stairs, MD Cell [phone]    01/22/2018 7:17 AM

## 2018-01-22 NOTE — Anesthesia Procedure Notes (Signed)
Procedure Name: LMA Insertion Date/Time: 01/22/2018 7:34 AM Performed by: Bonney Aid, CRNA Pre-anesthesia Checklist: Patient identified, Emergency Drugs available, Suction available and Patient being monitored Patient Re-evaluated:Patient Re-evaluated prior to induction Oxygen Delivery Method: Circle system utilized Preoxygenation: Pre-oxygenation with 100% oxygen Induction Type: IV induction Ventilation: Mask ventilation without difficulty LMA: LMA inserted LMA Size: 5.0 Number of attempts: 1 Airway Equipment and Method: Bite block Placement Confirmation: positive ETCO2 Tube secured with: Tape Dental Injury: Teeth and Oropharynx as per pre-operative assessment

## 2018-01-27 ENCOUNTER — Encounter (HOSPITAL_BASED_OUTPATIENT_CLINIC_OR_DEPARTMENT_OTHER): Payer: Self-pay | Admitting: Orthopedic Surgery

## 2018-01-27 DIAGNOSIS — J301 Allergic rhinitis due to pollen: Secondary | ICD-10-CM | POA: Diagnosis not present

## 2018-01-27 DIAGNOSIS — J3089 Other allergic rhinitis: Secondary | ICD-10-CM | POA: Diagnosis not present

## 2018-02-03 DIAGNOSIS — J3089 Other allergic rhinitis: Secondary | ICD-10-CM | POA: Diagnosis not present

## 2018-02-03 DIAGNOSIS — J301 Allergic rhinitis due to pollen: Secondary | ICD-10-CM | POA: Diagnosis not present

## 2018-02-04 DIAGNOSIS — M25662 Stiffness of left knee, not elsewhere classified: Secondary | ICD-10-CM | POA: Diagnosis not present

## 2018-02-09 DIAGNOSIS — M25662 Stiffness of left knee, not elsewhere classified: Secondary | ICD-10-CM | POA: Diagnosis not present

## 2018-02-10 DIAGNOSIS — J3089 Other allergic rhinitis: Secondary | ICD-10-CM | POA: Diagnosis not present

## 2018-02-10 DIAGNOSIS — J301 Allergic rhinitis due to pollen: Secondary | ICD-10-CM | POA: Diagnosis not present

## 2018-02-11 DIAGNOSIS — G4733 Obstructive sleep apnea (adult) (pediatric): Secondary | ICD-10-CM | POA: Diagnosis not present

## 2018-02-17 DIAGNOSIS — J3089 Other allergic rhinitis: Secondary | ICD-10-CM | POA: Diagnosis not present

## 2018-02-17 DIAGNOSIS — M25662 Stiffness of left knee, not elsewhere classified: Secondary | ICD-10-CM | POA: Diagnosis not present

## 2018-02-17 DIAGNOSIS — J301 Allergic rhinitis due to pollen: Secondary | ICD-10-CM | POA: Diagnosis not present

## 2018-02-24 DIAGNOSIS — M25662 Stiffness of left knee, not elsewhere classified: Secondary | ICD-10-CM | POA: Diagnosis not present

## 2018-02-24 DIAGNOSIS — J301 Allergic rhinitis due to pollen: Secondary | ICD-10-CM | POA: Diagnosis not present

## 2018-02-24 DIAGNOSIS — J3089 Other allergic rhinitis: Secondary | ICD-10-CM | POA: Diagnosis not present

## 2018-03-03 DIAGNOSIS — J301 Allergic rhinitis due to pollen: Secondary | ICD-10-CM | POA: Diagnosis not present

## 2018-03-03 DIAGNOSIS — M25662 Stiffness of left knee, not elsewhere classified: Secondary | ICD-10-CM | POA: Diagnosis not present

## 2018-03-03 DIAGNOSIS — J3089 Other allergic rhinitis: Secondary | ICD-10-CM | POA: Diagnosis not present

## 2018-03-04 DIAGNOSIS — S82102A Unspecified fracture of upper end of left tibia, initial encounter for closed fracture: Secondary | ICD-10-CM | POA: Diagnosis not present

## 2018-03-10 DIAGNOSIS — J3089 Other allergic rhinitis: Secondary | ICD-10-CM | POA: Diagnosis not present

## 2018-03-10 DIAGNOSIS — J301 Allergic rhinitis due to pollen: Secondary | ICD-10-CM | POA: Diagnosis not present

## 2018-03-17 DIAGNOSIS — J3089 Other allergic rhinitis: Secondary | ICD-10-CM | POA: Diagnosis not present

## 2018-03-17 DIAGNOSIS — J301 Allergic rhinitis due to pollen: Secondary | ICD-10-CM | POA: Diagnosis not present

## 2018-03-24 DIAGNOSIS — J301 Allergic rhinitis due to pollen: Secondary | ICD-10-CM | POA: Diagnosis not present

## 2018-03-24 DIAGNOSIS — J3089 Other allergic rhinitis: Secondary | ICD-10-CM | POA: Diagnosis not present

## 2018-03-30 DIAGNOSIS — J301 Allergic rhinitis due to pollen: Secondary | ICD-10-CM | POA: Diagnosis not present

## 2018-03-31 DIAGNOSIS — J3089 Other allergic rhinitis: Secondary | ICD-10-CM | POA: Diagnosis not present

## 2018-04-07 DIAGNOSIS — J3089 Other allergic rhinitis: Secondary | ICD-10-CM | POA: Diagnosis not present

## 2018-04-07 DIAGNOSIS — J301 Allergic rhinitis due to pollen: Secondary | ICD-10-CM | POA: Diagnosis not present

## 2018-04-14 DIAGNOSIS — J3089 Other allergic rhinitis: Secondary | ICD-10-CM | POA: Diagnosis not present

## 2018-04-14 DIAGNOSIS — J301 Allergic rhinitis due to pollen: Secondary | ICD-10-CM | POA: Diagnosis not present

## 2018-04-14 DIAGNOSIS — H1045 Other chronic allergic conjunctivitis: Secondary | ICD-10-CM | POA: Diagnosis not present

## 2018-04-28 DIAGNOSIS — S82142D Displaced bicondylar fracture of left tibia, subsequent encounter for closed fracture with routine healing: Secondary | ICD-10-CM | POA: Diagnosis not present

## 2018-04-28 DIAGNOSIS — J3089 Other allergic rhinitis: Secondary | ICD-10-CM | POA: Diagnosis not present

## 2018-04-28 DIAGNOSIS — J301 Allergic rhinitis due to pollen: Secondary | ICD-10-CM | POA: Diagnosis not present

## 2018-05-05 DIAGNOSIS — J301 Allergic rhinitis due to pollen: Secondary | ICD-10-CM | POA: Diagnosis not present

## 2018-05-05 DIAGNOSIS — J3089 Other allergic rhinitis: Secondary | ICD-10-CM | POA: Diagnosis not present

## 2018-05-18 DIAGNOSIS — J3089 Other allergic rhinitis: Secondary | ICD-10-CM | POA: Diagnosis not present

## 2018-05-18 DIAGNOSIS — J301 Allergic rhinitis due to pollen: Secondary | ICD-10-CM | POA: Diagnosis not present

## 2018-05-20 DIAGNOSIS — G4733 Obstructive sleep apnea (adult) (pediatric): Secondary | ICD-10-CM | POA: Diagnosis not present

## 2018-05-25 DIAGNOSIS — J301 Allergic rhinitis due to pollen: Secondary | ICD-10-CM | POA: Diagnosis not present

## 2018-05-25 DIAGNOSIS — J3089 Other allergic rhinitis: Secondary | ICD-10-CM | POA: Diagnosis not present

## 2018-06-01 DIAGNOSIS — J3089 Other allergic rhinitis: Secondary | ICD-10-CM | POA: Diagnosis not present

## 2018-06-01 DIAGNOSIS — J301 Allergic rhinitis due to pollen: Secondary | ICD-10-CM | POA: Diagnosis not present

## 2018-06-08 DIAGNOSIS — G4733 Obstructive sleep apnea (adult) (pediatric): Secondary | ICD-10-CM | POA: Diagnosis not present

## 2018-06-08 DIAGNOSIS — K219 Gastro-esophageal reflux disease without esophagitis: Secondary | ICD-10-CM | POA: Diagnosis not present

## 2018-06-08 DIAGNOSIS — J3089 Other allergic rhinitis: Secondary | ICD-10-CM | POA: Diagnosis not present

## 2018-06-08 DIAGNOSIS — I10 Essential (primary) hypertension: Secondary | ICD-10-CM | POA: Diagnosis not present

## 2018-06-08 DIAGNOSIS — J301 Allergic rhinitis due to pollen: Secondary | ICD-10-CM | POA: Diagnosis not present

## 2018-06-15 DIAGNOSIS — J3089 Other allergic rhinitis: Secondary | ICD-10-CM | POA: Diagnosis not present

## 2018-06-15 DIAGNOSIS — J301 Allergic rhinitis due to pollen: Secondary | ICD-10-CM | POA: Diagnosis not present

## 2018-06-23 DIAGNOSIS — J3089 Other allergic rhinitis: Secondary | ICD-10-CM | POA: Diagnosis not present

## 2018-06-23 DIAGNOSIS — J301 Allergic rhinitis due to pollen: Secondary | ICD-10-CM | POA: Diagnosis not present

## 2018-06-30 DIAGNOSIS — J3089 Other allergic rhinitis: Secondary | ICD-10-CM | POA: Diagnosis not present

## 2018-06-30 DIAGNOSIS — J301 Allergic rhinitis due to pollen: Secondary | ICD-10-CM | POA: Diagnosis not present

## 2018-06-30 DIAGNOSIS — L570 Actinic keratosis: Secondary | ICD-10-CM | POA: Diagnosis not present

## 2018-06-30 DIAGNOSIS — D1801 Hemangioma of skin and subcutaneous tissue: Secondary | ICD-10-CM | POA: Diagnosis not present

## 2018-06-30 DIAGNOSIS — L738 Other specified follicular disorders: Secondary | ICD-10-CM | POA: Diagnosis not present

## 2018-06-30 DIAGNOSIS — L814 Other melanin hyperpigmentation: Secondary | ICD-10-CM | POA: Diagnosis not present

## 2018-07-07 DIAGNOSIS — J3089 Other allergic rhinitis: Secondary | ICD-10-CM | POA: Diagnosis not present

## 2018-07-07 DIAGNOSIS — J301 Allergic rhinitis due to pollen: Secondary | ICD-10-CM | POA: Diagnosis not present

## 2018-07-14 DIAGNOSIS — J3089 Other allergic rhinitis: Secondary | ICD-10-CM | POA: Diagnosis not present

## 2018-07-14 DIAGNOSIS — J301 Allergic rhinitis due to pollen: Secondary | ICD-10-CM | POA: Diagnosis not present

## 2018-07-21 DIAGNOSIS — J301 Allergic rhinitis due to pollen: Secondary | ICD-10-CM | POA: Diagnosis not present

## 2018-07-21 DIAGNOSIS — J3089 Other allergic rhinitis: Secondary | ICD-10-CM | POA: Diagnosis not present

## 2018-07-28 DIAGNOSIS — S82102D Unspecified fracture of upper end of left tibia, subsequent encounter for closed fracture with routine healing: Secondary | ICD-10-CM | POA: Diagnosis not present

## 2018-07-28 DIAGNOSIS — J3089 Other allergic rhinitis: Secondary | ICD-10-CM | POA: Diagnosis not present

## 2018-07-28 DIAGNOSIS — J301 Allergic rhinitis due to pollen: Secondary | ICD-10-CM | POA: Diagnosis not present

## 2018-08-04 DIAGNOSIS — J301 Allergic rhinitis due to pollen: Secondary | ICD-10-CM | POA: Diagnosis not present

## 2018-08-04 DIAGNOSIS — J3089 Other allergic rhinitis: Secondary | ICD-10-CM | POA: Diagnosis not present

## 2018-08-11 DIAGNOSIS — J301 Allergic rhinitis due to pollen: Secondary | ICD-10-CM | POA: Diagnosis not present

## 2018-08-11 DIAGNOSIS — J3089 Other allergic rhinitis: Secondary | ICD-10-CM | POA: Diagnosis not present

## 2018-08-24 DIAGNOSIS — J301 Allergic rhinitis due to pollen: Secondary | ICD-10-CM | POA: Diagnosis not present

## 2018-08-24 DIAGNOSIS — J3089 Other allergic rhinitis: Secondary | ICD-10-CM | POA: Diagnosis not present

## 2018-08-26 DIAGNOSIS — G4733 Obstructive sleep apnea (adult) (pediatric): Secondary | ICD-10-CM | POA: Diagnosis not present

## 2018-09-01 DIAGNOSIS — J3089 Other allergic rhinitis: Secondary | ICD-10-CM | POA: Diagnosis not present

## 2018-09-01 DIAGNOSIS — J301 Allergic rhinitis due to pollen: Secondary | ICD-10-CM | POA: Diagnosis not present

## 2018-09-08 DIAGNOSIS — J3089 Other allergic rhinitis: Secondary | ICD-10-CM | POA: Diagnosis not present

## 2018-09-08 DIAGNOSIS — J301 Allergic rhinitis due to pollen: Secondary | ICD-10-CM | POA: Diagnosis not present

## 2018-09-16 DIAGNOSIS — J301 Allergic rhinitis due to pollen: Secondary | ICD-10-CM | POA: Diagnosis not present

## 2018-09-16 DIAGNOSIS — J3089 Other allergic rhinitis: Secondary | ICD-10-CM | POA: Diagnosis not present

## 2018-09-23 DIAGNOSIS — J301 Allergic rhinitis due to pollen: Secondary | ICD-10-CM | POA: Diagnosis not present

## 2018-09-23 DIAGNOSIS — J3089 Other allergic rhinitis: Secondary | ICD-10-CM | POA: Diagnosis not present

## 2018-09-30 DIAGNOSIS — J301 Allergic rhinitis due to pollen: Secondary | ICD-10-CM | POA: Diagnosis not present

## 2018-10-01 DIAGNOSIS — J3089 Other allergic rhinitis: Secondary | ICD-10-CM | POA: Diagnosis not present

## 2018-10-05 DIAGNOSIS — J3089 Other allergic rhinitis: Secondary | ICD-10-CM | POA: Diagnosis not present

## 2018-10-05 DIAGNOSIS — J301 Allergic rhinitis due to pollen: Secondary | ICD-10-CM | POA: Diagnosis not present

## 2018-10-12 DIAGNOSIS — J3089 Other allergic rhinitis: Secondary | ICD-10-CM | POA: Diagnosis not present

## 2018-10-12 DIAGNOSIS — J301 Allergic rhinitis due to pollen: Secondary | ICD-10-CM | POA: Diagnosis not present

## 2018-10-26 DIAGNOSIS — J301 Allergic rhinitis due to pollen: Secondary | ICD-10-CM | POA: Diagnosis not present

## 2018-10-26 DIAGNOSIS — J3089 Other allergic rhinitis: Secondary | ICD-10-CM | POA: Diagnosis not present

## 2018-11-02 DIAGNOSIS — J301 Allergic rhinitis due to pollen: Secondary | ICD-10-CM | POA: Diagnosis not present

## 2018-11-02 DIAGNOSIS — J3089 Other allergic rhinitis: Secondary | ICD-10-CM | POA: Diagnosis not present

## 2018-11-09 DIAGNOSIS — J3089 Other allergic rhinitis: Secondary | ICD-10-CM | POA: Diagnosis not present

## 2018-11-09 DIAGNOSIS — J301 Allergic rhinitis due to pollen: Secondary | ICD-10-CM | POA: Diagnosis not present

## 2018-11-16 DIAGNOSIS — J301 Allergic rhinitis due to pollen: Secondary | ICD-10-CM | POA: Diagnosis not present

## 2018-11-16 DIAGNOSIS — J3089 Other allergic rhinitis: Secondary | ICD-10-CM | POA: Diagnosis not present

## 2018-11-23 DIAGNOSIS — J3089 Other allergic rhinitis: Secondary | ICD-10-CM | POA: Diagnosis not present

## 2018-11-23 DIAGNOSIS — J301 Allergic rhinitis due to pollen: Secondary | ICD-10-CM | POA: Diagnosis not present

## 2018-11-30 DIAGNOSIS — J301 Allergic rhinitis due to pollen: Secondary | ICD-10-CM | POA: Diagnosis not present

## 2018-11-30 DIAGNOSIS — J3089 Other allergic rhinitis: Secondary | ICD-10-CM | POA: Diagnosis not present

## 2018-12-07 DIAGNOSIS — J3089 Other allergic rhinitis: Secondary | ICD-10-CM | POA: Diagnosis not present

## 2018-12-07 DIAGNOSIS — J301 Allergic rhinitis due to pollen: Secondary | ICD-10-CM | POA: Diagnosis not present

## 2018-12-11 DIAGNOSIS — Z125 Encounter for screening for malignant neoplasm of prostate: Secondary | ICD-10-CM | POA: Diagnosis not present

## 2018-12-11 DIAGNOSIS — J309 Allergic rhinitis, unspecified: Secondary | ICD-10-CM | POA: Diagnosis not present

## 2018-12-11 DIAGNOSIS — I10 Essential (primary) hypertension: Secondary | ICD-10-CM | POA: Diagnosis not present

## 2018-12-11 DIAGNOSIS — R7309 Other abnormal glucose: Secondary | ICD-10-CM | POA: Diagnosis not present

## 2018-12-11 DIAGNOSIS — Z0000 Encounter for general adult medical examination without abnormal findings: Secondary | ICD-10-CM | POA: Diagnosis not present

## 2018-12-11 DIAGNOSIS — Z1389 Encounter for screening for other disorder: Secondary | ICD-10-CM | POA: Diagnosis not present

## 2018-12-11 DIAGNOSIS — E7800 Pure hypercholesterolemia, unspecified: Secondary | ICD-10-CM | POA: Diagnosis not present

## 2018-12-14 DIAGNOSIS — J301 Allergic rhinitis due to pollen: Secondary | ICD-10-CM | POA: Diagnosis not present

## 2018-12-14 DIAGNOSIS — J3089 Other allergic rhinitis: Secondary | ICD-10-CM | POA: Diagnosis not present

## 2018-12-21 DIAGNOSIS — J3089 Other allergic rhinitis: Secondary | ICD-10-CM | POA: Diagnosis not present

## 2018-12-21 DIAGNOSIS — J301 Allergic rhinitis due to pollen: Secondary | ICD-10-CM | POA: Diagnosis not present

## 2018-12-28 DIAGNOSIS — J3089 Other allergic rhinitis: Secondary | ICD-10-CM | POA: Diagnosis not present

## 2018-12-28 DIAGNOSIS — J301 Allergic rhinitis due to pollen: Secondary | ICD-10-CM | POA: Diagnosis not present

## 2019-01-05 DIAGNOSIS — J3089 Other allergic rhinitis: Secondary | ICD-10-CM | POA: Diagnosis not present

## 2019-01-05 DIAGNOSIS — J301 Allergic rhinitis due to pollen: Secondary | ICD-10-CM | POA: Diagnosis not present

## 2019-01-12 DIAGNOSIS — J3089 Other allergic rhinitis: Secondary | ICD-10-CM | POA: Diagnosis not present

## 2019-01-12 DIAGNOSIS — J301 Allergic rhinitis due to pollen: Secondary | ICD-10-CM | POA: Diagnosis not present

## 2019-01-26 DIAGNOSIS — J301 Allergic rhinitis due to pollen: Secondary | ICD-10-CM | POA: Diagnosis not present

## 2019-01-26 DIAGNOSIS — J3089 Other allergic rhinitis: Secondary | ICD-10-CM | POA: Diagnosis not present

## 2019-02-09 DIAGNOSIS — J301 Allergic rhinitis due to pollen: Secondary | ICD-10-CM | POA: Diagnosis not present

## 2019-02-09 DIAGNOSIS — J3089 Other allergic rhinitis: Secondary | ICD-10-CM | POA: Diagnosis not present

## 2019-02-23 DIAGNOSIS — J3089 Other allergic rhinitis: Secondary | ICD-10-CM | POA: Diagnosis not present

## 2019-02-23 DIAGNOSIS — J301 Allergic rhinitis due to pollen: Secondary | ICD-10-CM | POA: Diagnosis not present

## 2019-03-03 DIAGNOSIS — J301 Allergic rhinitis due to pollen: Secondary | ICD-10-CM | POA: Diagnosis not present

## 2019-03-03 DIAGNOSIS — J3089 Other allergic rhinitis: Secondary | ICD-10-CM | POA: Diagnosis not present

## 2020-02-22 IMAGING — CT CT KNEE*L* W/O CM
1 series · 12 of 14 positions shown, 15 images · non-contrast
Comparison: None.

CLINICAL DATA: Left knee fracture 4 days ago status post fall

EXAM:
CT OF THE LEFT KNEE WITHOUT CONTRAST
TECHNIQUE: Multidetector CT imaging of the LEFT knee was performed according to
the standard protocol. Multiplanar CT image reconstructions were
also generated.

[Series 3: knee bone · axial · 0.36mm/px · z∈[-260,-41]mm · 12 of 87 slices shown, 15 images]
[im 7/87  soft-tissue]
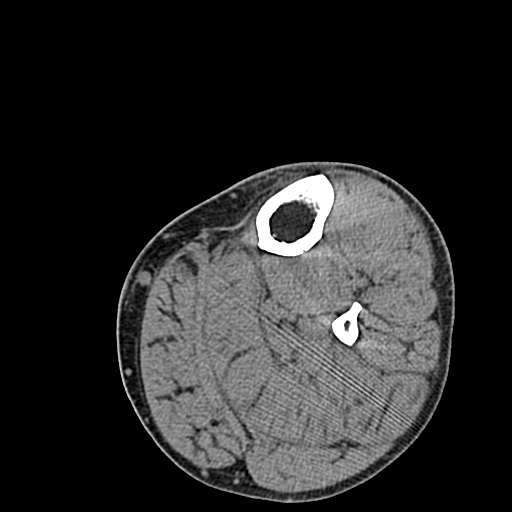
[im 7/87  bone]
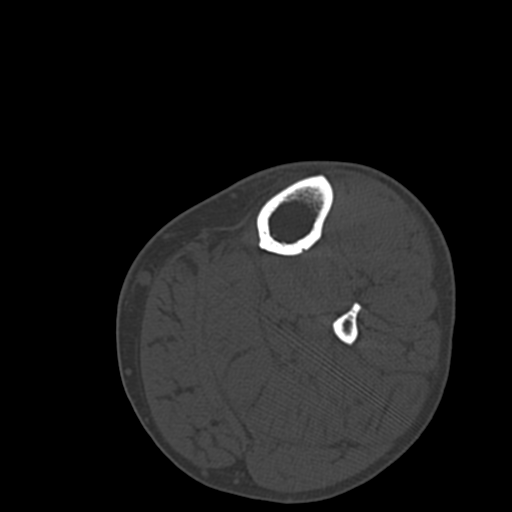
[im 14/87  bone]
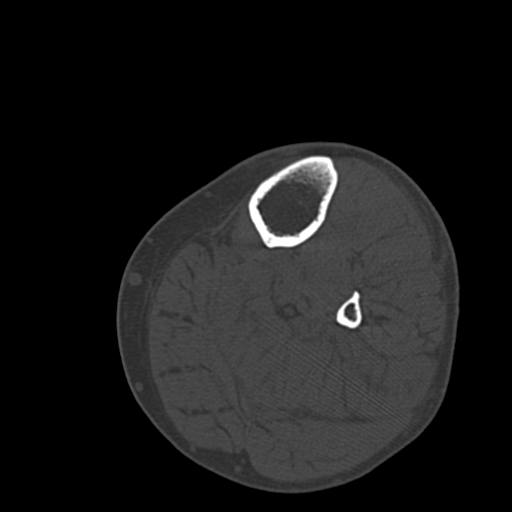
[im 20/87  bone]
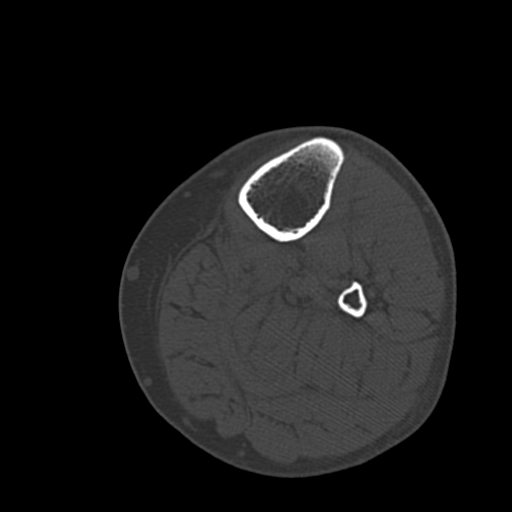
[im 27/87  bone]
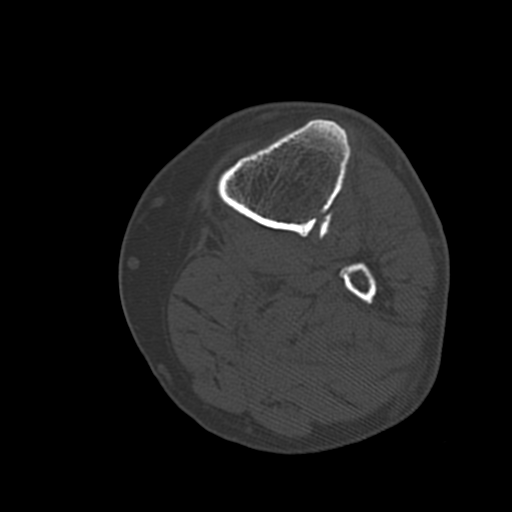
[im 34/87  soft-tissue]
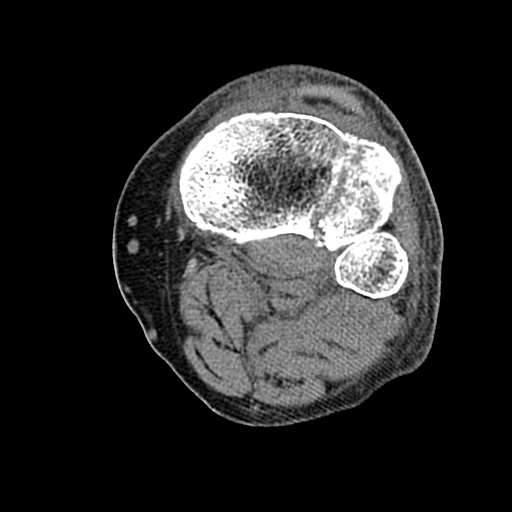
[im 34/87  bone]
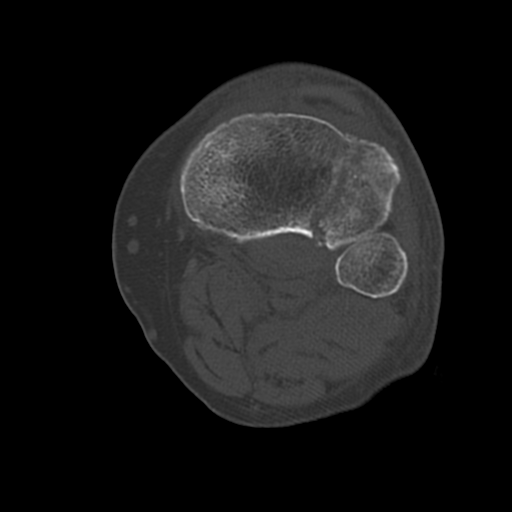
[im 40/87  bone]
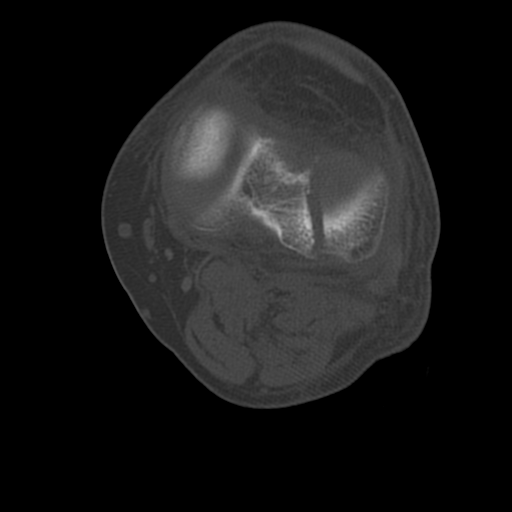
[im 47/87  bone]
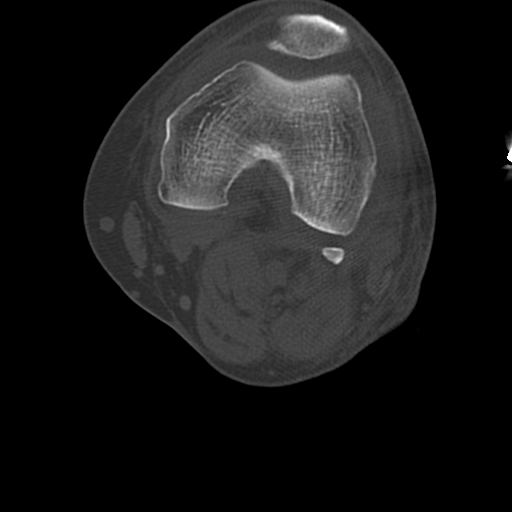
[im 53/87  bone]
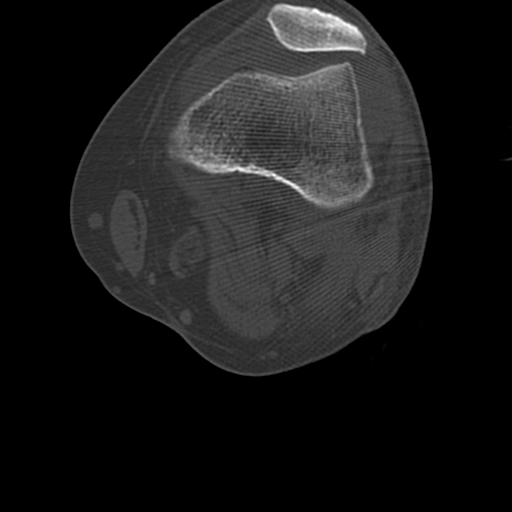
[im 60/87  soft-tissue]
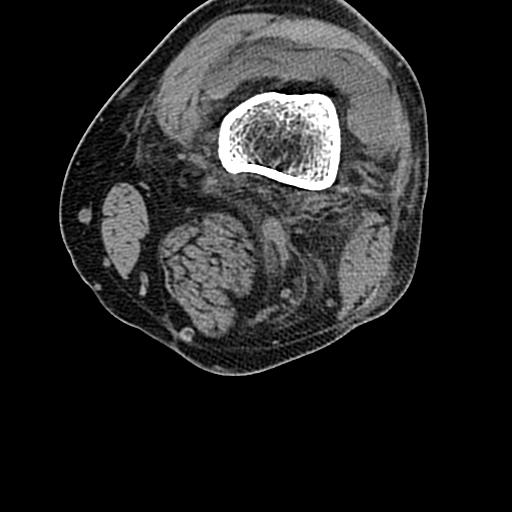
[im 60/87  bone]
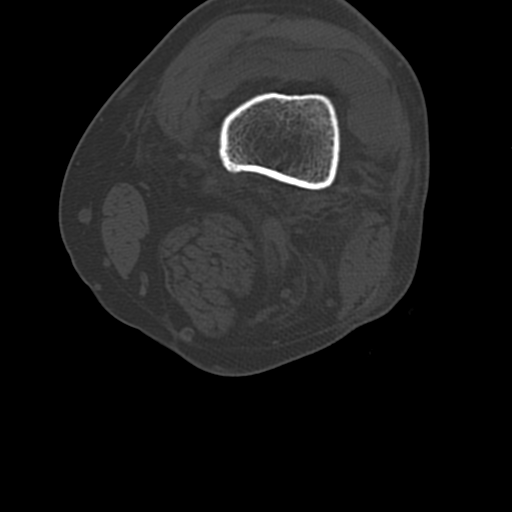
[im 67/87  bone]
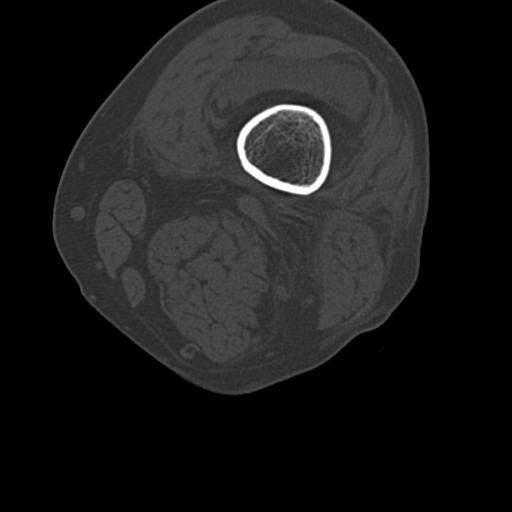
[im 73/87  bone]
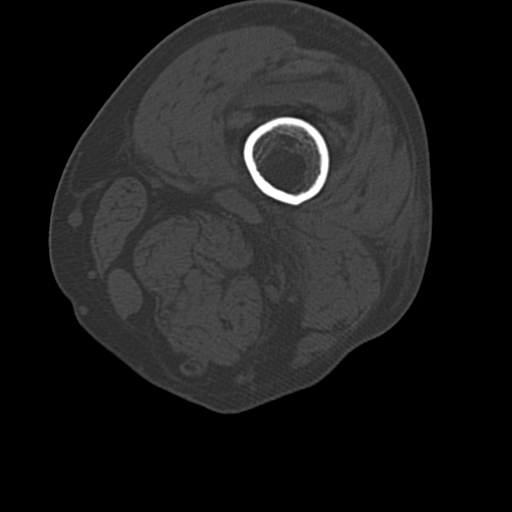
[im 80/87  bone]
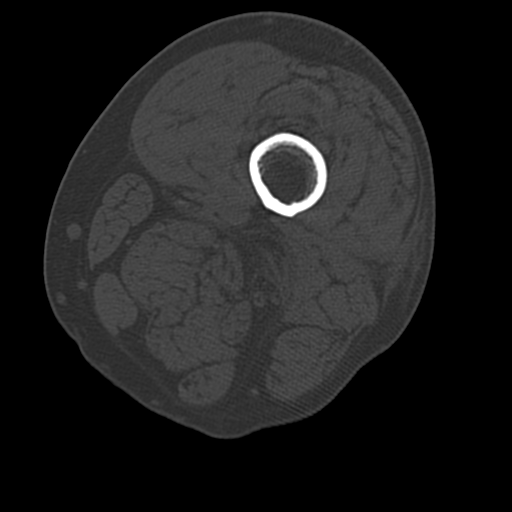

[12 of 14 positions shown; findings below may reference images not displayed]

FINDINGS: Bones/Joint/Cartilage

Mildly comminuted lateral tibial plateau fracture with 3 mm of
depression and 4 mm of peripheral displacement of the major fracture
fragment. Fracture cleft involves the articular surface.

No other acute fracture or dislocation. No aggressive osseous
lesion.

Large hemarthrosis.

Ligaments

Suboptimally assessed by CT.  ACL and PCL are grossly intact.

Muscles and Tendons

Muscles are normal. No muscle atrophy. Quadriceps tendon and
patellar tendon are intact.

Soft tissues

No soft tissue mass.  No fluid collection or hematoma.
IMPRESSION: 1. Isolated mildly comminuted lateral tibial plateau fracture with 3
mm of depression and 4 mm of peripheral displacement of the major
fracture fragment. Fracture involves the articular surface. Large
hemarthrosis.
